# Patient Record
Sex: Male | Born: 1950
Health system: Southern US, Community
[De-identification: ages and names within clinical notes are randomized; demographics above are authoritative.]

## PROBLEM LIST (undated history)

## (undated) DIAGNOSIS — N529 Male erectile dysfunction, unspecified: Secondary | ICD-10-CM

## (undated) DIAGNOSIS — R7303 Prediabetes: Secondary | ICD-10-CM

## (undated) DIAGNOSIS — Z639 Problem related to primary support group, unspecified: Secondary | ICD-10-CM

## (undated) DIAGNOSIS — I10 Essential (primary) hypertension: Secondary | ICD-10-CM

## (undated) DIAGNOSIS — F329 Major depressive disorder, single episode, unspecified: Secondary | ICD-10-CM

## (undated) DIAGNOSIS — F32A Depression, unspecified: Secondary | ICD-10-CM

## (undated) DIAGNOSIS — R17 Unspecified jaundice: Secondary | ICD-10-CM

## (undated) DIAGNOSIS — I2510 Atherosclerotic heart disease of native coronary artery without angina pectoris: Secondary | ICD-10-CM

## (undated) DIAGNOSIS — D696 Thrombocytopenia, unspecified: Secondary | ICD-10-CM

## (undated) DIAGNOSIS — E785 Hyperlipidemia, unspecified: Secondary | ICD-10-CM

## (undated) HISTORY — DX: Major depressive disorder, single episode, unspecified: F32.9

## (undated) HISTORY — DX: Atherosclerotic heart disease of native coronary artery without angina pectoris: I25.10

## (undated) HISTORY — DX: Male erectile dysfunction, unspecified: N52.9

## (undated) HISTORY — DX: Problem related to primary support group, unspecified: Z63.9

## (undated) HISTORY — DX: Thrombocytopenia, unspecified: D69.6

## (undated) HISTORY — DX: Depression, unspecified: F32.A

## (undated) HISTORY — DX: Hyperlipidemia, unspecified: E78.5

## (undated) HISTORY — DX: Essential (primary) hypertension: I10

## (undated) HISTORY — DX: Prediabetes: R73.03

## (undated) HISTORY — DX: Unspecified jaundice: R17

---
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
## 1994-09-22 HISTORY — PX: HERNIA REPAIR: SHX51

## 2003-03-23 HISTORY — PX: CORONARY ANGIOPLASTY WITH STENT PLACEMENT: SHX49

## 2003-03-23 HISTORY — PX: CARDIAC CATHETERIZATION: SHX172

## 2003-04-23 HISTORY — PX: CORONARY ANGIOPLASTY WITH STENT PLACEMENT: SHX49

## 2004-08-21 ENCOUNTER — Ambulatory Visit: Payer: Self-pay | Admitting: Family Medicine

## 2005-03-22 HISTORY — PX: OTHER SURGICAL HISTORY: SHX169

## 2005-04-14 ENCOUNTER — Ambulatory Visit: Payer: Self-pay | Admitting: Internal Medicine

## 2005-04-15 ENCOUNTER — Encounter: Payer: Self-pay | Admitting: Family Medicine

## 2005-04-15 LAB — CONVERTED CEMR LAB: Blood Glucose, Fasting: 81 mg/dL

## 2005-04-21 ENCOUNTER — Ambulatory Visit: Payer: Self-pay

## 2005-05-08 ENCOUNTER — Emergency Department (HOSPITAL_COMMUNITY): Admission: EM | Admit: 2005-05-08 | Discharge: 2005-05-08 | Payer: Self-pay | Admitting: Emergency Medicine

## 2005-05-13 ENCOUNTER — Ambulatory Visit: Payer: Self-pay | Admitting: Cardiology

## 2005-05-21 ENCOUNTER — Ambulatory Visit: Payer: Self-pay

## 2005-12-08 ENCOUNTER — Ambulatory Visit: Payer: Self-pay | Admitting: Family Medicine

## 2006-01-19 ENCOUNTER — Ambulatory Visit: Payer: Self-pay | Admitting: Family Medicine

## 2006-05-11 ENCOUNTER — Ambulatory Visit: Payer: Self-pay | Admitting: Family Medicine

## 2006-10-05 ENCOUNTER — Ambulatory Visit: Payer: Self-pay | Admitting: Family Medicine

## 2006-10-05 LAB — CONVERTED CEMR LAB
ALT: 44 units/L — ABNORMAL HIGH (ref 0–40)
BUN: 14 mg/dL (ref 6–23)
CO2: 31 meq/L (ref 19–32)
Calcium: 9.2 mg/dL (ref 8.4–10.5)
Chloride: 109 meq/L (ref 96–112)
Chol/HDL Ratio, serum: 2.6
Cholesterol: 136 mg/dL (ref 0–200)
Creatinine, Ser: 1 mg/dL (ref 0.4–1.5)
GFR calc non Af Amer: 83 mL/min
HDL: 52.5 mg/dL (ref >39.0)
LDL Cholesterol: 66 mg/dL (ref 0–99)
Sodium: 144 meq/L (ref 135–145)
TSH: 1.21 microintl units/mL (ref 0.35–5.50)
Total Protein: 6.3 g/dL (ref 6.0–8.3)

## 2006-10-26 ENCOUNTER — Ambulatory Visit: Payer: Self-pay | Admitting: Family Medicine

## 2008-01-11 ENCOUNTER — Encounter: Payer: Self-pay | Admitting: Family Medicine

## 2008-01-11 ENCOUNTER — Ambulatory Visit: Payer: Self-pay | Admitting: Family Medicine

## 2008-01-11 DIAGNOSIS — I10 Essential (primary) hypertension: Secondary | ICD-10-CM | POA: Insufficient documentation

## 2008-01-11 DIAGNOSIS — I2510 Atherosclerotic heart disease of native coronary artery without angina pectoris: Secondary | ICD-10-CM | POA: Insufficient documentation

## 2008-01-11 DIAGNOSIS — E1169 Type 2 diabetes mellitus with other specified complication: Secondary | ICD-10-CM | POA: Insufficient documentation

## 2008-01-11 DIAGNOSIS — E782 Mixed hyperlipidemia: Secondary | ICD-10-CM | POA: Insufficient documentation

## 2008-01-24 ENCOUNTER — Telehealth: Payer: Self-pay | Admitting: Family Medicine

## 2008-02-15 ENCOUNTER — Ambulatory Visit: Payer: Self-pay | Admitting: Family Medicine

## 2008-02-15 LAB — CONVERTED CEMR LAB
ALT: 49 units/L (ref 0–53)
Albumin: 4.5 g/dL (ref 3.5–5.2)
Alkaline Phosphatase: 69 units/L (ref 39–117)
BUN: 17 mg/dL (ref 6–23)
Basophils Absolute: 0 10*3/uL (ref 0.0–0.1)
Bilirubin, Direct: 0.2 mg/dL (ref 0.0–0.3)
Eosinophils Absolute: 0.1 10*3/uL (ref 0.0–0.7)
GFR calc non Af Amer: 93 mL/min
Glucose, Bld: 121 mg/dL — ABNORMAL HIGH (ref 70–99)
HCT: 53.6 % — ABNORMAL HIGH (ref 39.0–52.0)
HDL: 47 mg/dL (ref >39.0)
Hemoglobin: 18.1 g/dL — ABNORMAL HIGH (ref 13.0–17.0)
LDL Cholesterol: 117 mg/dL — ABNORMAL HIGH (ref 0–99)
Lymphocytes Relative: 15.4 % (ref 12.0–46.0)
MCV: 93.9 fL (ref 78.0–100.0)
Monocytes Absolute: 0.6 10*3/uL (ref 0.1–1.0)
Neutrophils Relative %: 75.4 % (ref 43.0–77.0)
PSA: 0.46 ng/mL (ref 0.10–4.00)
Platelets: 151 10*3/uL (ref 150–400)
Potassium: 4.1 meq/L (ref 3.5–5.1)
Total Protein: 6.8 g/dL (ref 6.0–8.3)
Triglycerides: 115 mg/dL (ref 0–149)
WBC: 7.2 10*3/uL (ref 4.5–10.5)

## 2008-05-19 ENCOUNTER — Encounter (INDEPENDENT_AMBULATORY_CARE_PROVIDER_SITE_OTHER): Payer: Self-pay | Admitting: *Deleted

## 2008-06-05 ENCOUNTER — Telehealth: Payer: Self-pay | Admitting: Family Medicine

## 2008-10-16 ENCOUNTER — Ambulatory Visit: Payer: Self-pay | Admitting: Family Medicine

## 2008-10-16 LAB — CONVERTED CEMR LAB
ALT: 41 units/L (ref 0–53)
AST: 24 units/L (ref 0–37)
Alkaline Phosphatase: 76 units/L (ref 39–117)
BUN: 15 mg/dL (ref 6–23)
Basophils Absolute: 0 10*3/uL (ref 0.0–0.1)
CO2: 27 meq/L (ref 19–32)
Calcium: 9.2 mg/dL (ref 8.4–10.5)
Cholesterol: 136 mg/dL (ref 0–200)
Eosinophils Relative: 1.5 % (ref 0.0–5.0)
GFR calc non Af Amer: 82 mL/min
Glucose, Bld: 112 mg/dL — ABNORMAL HIGH (ref 70–99)
MCV: 92.9 fL (ref 78.0–100.0)
Neutro Abs: 4.8 10*3/uL (ref 1.4–7.7)
Potassium: 4.7 meq/L (ref 3.5–5.1)
RBC: 5.39 M/uL (ref 4.22–5.81)
RDW: 12.4 % (ref 11.5–14.6)
Total Bilirubin: 1.4 mg/dL — ABNORMAL HIGH (ref 0.3–1.2)
Triglycerides: 128 mg/dL (ref 0–149)
VLDL: 26 mg/dL (ref 0–40)
WBC: 6.5 10*3/uL (ref 4.5–10.5)

## 2008-10-19 ENCOUNTER — Ambulatory Visit: Payer: Self-pay | Admitting: Family Medicine

## 2008-10-19 DIAGNOSIS — E119 Type 2 diabetes mellitus without complications: Secondary | ICD-10-CM | POA: Insufficient documentation

## 2008-10-19 DIAGNOSIS — E1169 Type 2 diabetes mellitus with other specified complication: Secondary | ICD-10-CM | POA: Insufficient documentation

## 2008-10-19 DIAGNOSIS — R739 Hyperglycemia, unspecified: Secondary | ICD-10-CM

## 2008-10-19 DIAGNOSIS — E1165 Type 2 diabetes mellitus with hyperglycemia: Secondary | ICD-10-CM | POA: Insufficient documentation

## 2008-10-19 DIAGNOSIS — R809 Proteinuria, unspecified: Secondary | ICD-10-CM | POA: Insufficient documentation

## 2008-10-19 DIAGNOSIS — R17 Unspecified jaundice: Secondary | ICD-10-CM | POA: Insufficient documentation

## 2008-11-17 ENCOUNTER — Ambulatory Visit: Payer: Self-pay | Admitting: Family Medicine

## 2008-11-17 LAB — CONVERTED CEMR LAB
OCCULT 1: NEGATIVE
OCCULT 2: NEGATIVE

## 2008-11-20 ENCOUNTER — Encounter (INDEPENDENT_AMBULATORY_CARE_PROVIDER_SITE_OTHER): Payer: Self-pay | Admitting: *Deleted

## 2008-12-18 ENCOUNTER — Ambulatory Visit: Payer: Self-pay | Admitting: Family Medicine

## 2009-12-17 ENCOUNTER — Telehealth: Payer: Self-pay | Admitting: Family Medicine

## 2009-12-24 ENCOUNTER — Ambulatory Visit: Payer: Self-pay | Admitting: Family Medicine

## 2009-12-24 LAB — CONVERTED CEMR LAB
LDL Cholesterol: 66 mg/dL (ref 0–99)
Total CHOL/HDL Ratio: 3
Triglycerides: 89 mg/dL (ref 0.0–149.0)
VLDL: 17.8 mg/dL (ref 0.0–40.0)

## 2009-12-26 ENCOUNTER — Ambulatory Visit: Payer: Self-pay | Admitting: Family Medicine

## 2010-01-23 ENCOUNTER — Observation Stay (HOSPITAL_COMMUNITY): Admission: EM | Admit: 2010-01-23 | Discharge: 2010-01-24 | Payer: Self-pay | Admitting: Internal Medicine

## 2010-01-23 ENCOUNTER — Ambulatory Visit: Payer: Self-pay | Admitting: Internal Medicine

## 2010-01-23 ENCOUNTER — Encounter: Payer: Self-pay | Admitting: Emergency Medicine

## 2010-01-23 HISTORY — PX: CORONARY ANGIOPLASTY WITH STENT PLACEMENT: SHX49

## 2010-02-06 ENCOUNTER — Telehealth: Payer: Self-pay | Admitting: Cardiovascular Disease

## 2010-02-06 ENCOUNTER — Ambulatory Visit: Payer: Self-pay | Admitting: Cardiovascular Disease

## 2010-02-22 ENCOUNTER — Telehealth: Payer: Self-pay | Admitting: Cardiovascular Disease

## 2010-03-06 ENCOUNTER — Telehealth: Payer: Self-pay | Admitting: Cardiovascular Disease

## 2010-04-12 ENCOUNTER — Telehealth: Payer: Self-pay | Admitting: Cardiovascular Disease

## 2010-04-23 ENCOUNTER — Telehealth: Payer: Self-pay | Admitting: Family Medicine

## 2010-04-25 ENCOUNTER — Ambulatory Visit: Payer: Self-pay | Admitting: Family Medicine

## 2010-04-25 LAB — CONVERTED CEMR LAB
ALT: 51 units/L (ref 0–53)
Albumin: 4.5 g/dL (ref 3.5–5.2)
Alkaline Phosphatase: 67 units/L (ref 39–117)
Basophils Relative: 0.4 % (ref 0.0–3.0)
CO2: 28 meq/L (ref 19–32)
Calcium: 9.4 mg/dL (ref 8.4–10.5)
GFR calc non Af Amer: 91.21 mL/min (ref >60)
HCT: 45.4 % (ref 39.0–52.0)
Hemoglobin: 16 g/dL (ref 13.0–17.0)
Lymphs Abs: 1 10*3/uL (ref 0.7–4.0)
MCV: 96.4 fL (ref 78.0–100.0)
Monocytes Relative: 8.5 % (ref 3.0–12.0)
Neutro Abs: 3.8 10*3/uL (ref 1.4–7.7)
PSA: 0.47 ng/mL (ref 0.10–4.00)
RBC: 4.71 M/uL (ref 4.22–5.81)
Total Bilirubin: 1.5 mg/dL — ABNORMAL HIGH (ref 0.3–1.2)
WBC: 5.3 10*3/uL (ref 4.5–10.5)

## 2010-04-30 ENCOUNTER — Ambulatory Visit: Payer: Self-pay | Admitting: Internal Medicine

## 2010-04-30 ENCOUNTER — Encounter (INDEPENDENT_AMBULATORY_CARE_PROVIDER_SITE_OTHER): Payer: Self-pay | Admitting: *Deleted

## 2010-04-30 DIAGNOSIS — D696 Thrombocytopenia, unspecified: Secondary | ICD-10-CM | POA: Insufficient documentation

## 2010-05-07 ENCOUNTER — Encounter (INDEPENDENT_AMBULATORY_CARE_PROVIDER_SITE_OTHER): Payer: Self-pay | Admitting: *Deleted

## 2010-05-09 ENCOUNTER — Telehealth: Payer: Self-pay | Admitting: Family Medicine

## 2010-05-09 ENCOUNTER — Encounter (INDEPENDENT_AMBULATORY_CARE_PROVIDER_SITE_OTHER): Payer: Self-pay | Admitting: *Deleted

## 2010-05-15 ENCOUNTER — Encounter: Payer: Self-pay | Admitting: Family Medicine

## 2010-05-15 ENCOUNTER — Ambulatory Visit: Payer: Self-pay | Admitting: Internal Medicine

## 2010-05-15 LAB — CONVERTED CEMR LAB
Bilirubin Urine: NEGATIVE
Glucose, Urine, Semiquant: NEGATIVE
Ketones, urine, test strip: NEGATIVE
Protein, U semiquant: NEGATIVE
WBC Urine, dipstick: NEGATIVE
pH: 6

## 2010-05-16 ENCOUNTER — Ambulatory Visit: Payer: Self-pay | Admitting: Internal Medicine

## 2010-06-11 ENCOUNTER — Ambulatory Visit: Payer: Self-pay | Admitting: Family Medicine

## 2010-06-13 ENCOUNTER — Telehealth: Payer: Self-pay | Admitting: Family Medicine

## 2010-06-13 ENCOUNTER — Telehealth: Payer: Self-pay | Admitting: Cardiovascular Disease

## 2010-06-19 ENCOUNTER — Ambulatory Visit: Payer: Self-pay | Admitting: Family Medicine

## 2010-06-20 ENCOUNTER — Encounter: Payer: Self-pay | Admitting: Family Medicine

## 2010-06-20 ENCOUNTER — Encounter (INDEPENDENT_AMBULATORY_CARE_PROVIDER_SITE_OTHER): Payer: Self-pay | Admitting: *Deleted

## 2010-06-20 LAB — CONVERTED CEMR LAB: Fecal Occult Bld: NEGATIVE

## 2010-07-04 ENCOUNTER — Telehealth: Payer: Self-pay | Admitting: Cardiovascular Disease

## 2010-07-29 ENCOUNTER — Telehealth (INDEPENDENT_AMBULATORY_CARE_PROVIDER_SITE_OTHER): Payer: Self-pay | Admitting: *Deleted

## 2010-07-30 ENCOUNTER — Ambulatory Visit: Payer: Self-pay | Admitting: Family Medicine

## 2010-07-30 ENCOUNTER — Ambulatory Visit: Payer: Self-pay | Admitting: Internal Medicine

## 2010-07-31 LAB — CONVERTED CEMR LAB
Basophils Relative: 0.4 % (ref 0.0–3.0)
CO2: 32 meq/L (ref 19–32)
Chloride: 103 meq/L (ref 96–112)
Eosinophils Absolute: 0 10*3/uL (ref 0.0–0.7)
Eosinophils Relative: 0.5 % (ref 0.0–5.0)
GFR calc non Af Amer: 84.65 mL/min (ref >60)
Glucose, Bld: 109 mg/dL — ABNORMAL HIGH (ref 70–99)
Lymphocytes Relative: 22.1 % (ref 12.0–46.0)
Monocytes Relative: 8.2 % (ref 3.0–12.0)
Neutrophils Relative %: 68.8 % (ref 43.0–77.0)
Potassium: 4.2 meq/L (ref 3.5–5.1)
RBC: 4.92 M/uL (ref 4.22–5.81)
Sodium: 141 meq/L (ref 135–145)
WBC: 6.3 10*3/uL (ref 4.5–10.5)

## 2010-08-20 ENCOUNTER — Ambulatory Visit: Payer: Self-pay | Admitting: Internal Medicine

## 2010-09-20 ENCOUNTER — Encounter: Payer: Self-pay | Admitting: Cardiovascular Disease

## 2010-09-20 ENCOUNTER — Ambulatory Visit
Admission: RE | Admit: 2010-09-20 | Discharge: 2010-09-20 | Payer: Self-pay | Source: Home / Self Care | Attending: Cardiovascular Disease | Admitting: Cardiovascular Disease

## 2010-09-20 DIAGNOSIS — N529 Male erectile dysfunction, unspecified: Secondary | ICD-10-CM | POA: Insufficient documentation

## 2010-10-22 ENCOUNTER — Telehealth: Payer: Self-pay | Admitting: Family Medicine

## 2010-10-22 NOTE — Assessment & Plan Note (Signed)
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  has to be at job for 90 days to get insurance.  currently on Cobra.  HLD - crestor working out well.  asks about adding fish oil to diet.  HTN - no HA, vision changes, chest pain, tightness, SOB, urinary changes, LE swelling.  tolerating meds well.  CAD - recent stent this year - on plavix and asa.  to schedule appt with [facility].    prediabetes - cbg fasting 109 last check, down from previously.  Current Medications (verified): 1)  Crestor 20 Mg  Tabs (Rosuvastatin Calcium) .Marland Kitchen.. 1 Daily By Mouth At Bedtime 2)  Hydrochlorothiazide 25 Mg Tabs (Hydrochlorothiazide) .... Take One Daily For Blood Pressure 3)  Aspirin 325 Mg Tabs (Aspirin) .... One Daily 4)  Metoprolol Tartrate 25 Mg Tabs (Metoprolol Tartrate) .Marland Kitchen.. 1 By Mouth Two Times A Day 5)  Plavix 75 Mg Tabs (Clopidogrel Bisulfate) .... Take One Tablet By Mouth Daily 6)  Nitrostat 0.4 Mg Subl (Nitroglycerin) .Marland Kitchen.. 1 Tablet Under Tongue At Onset of Chest Pain; You May Repeat Every 5 Minutes For Up To 3 Doses.  Allergies: 1)  Effient (Prasugrel Hcl)  Past History:  Social History: Last updated: 04/30/2010 Marital Status: Married LIVES WITH WIFE Children: 1 13 YOA Occupation: Unemployed, wants to start driving trucks  Past Medical History: CAD Hyperlipidemia Hypertension Prediabetes thrombocytopenia former smoker (quit 01/2010)  Review of Systems       per HPI  Physical Exam  General:  Well-developed,well-nourished,in no acute distress; alert,appropriate and cooperative  throughout examination Mouth:  Oral mucosa and oropharynx without lesions or exudates.  Teeth in good repair. Neck:  No deformities, masses, or tenderness noted.  no bruits Lungs:  Normal respiratory effort, chest expands symmetrically. Lungs are clear to auscultation, no crackles or wheezes. Heart:  Normal rate and regular rhythm. S1 and S2 normal without gallop, murmur, click, rub or other extra sounds. Abdomen:  Bowel sounds positive,abdomen soft and non-tender without masses, organomegaly or hernias noted.  no abd/renal bruits Pulses:  2+ rad pulses Extremities:  no pedal edema Skin:  Intact without suspicious lesions or rashes   Impression & Recommendations:  Problem # 1:  HYPERTENSION, BENIGN ESSENTIAL (ICD-401.1) continue current meds.  adequate control.  His updated medication list for this problem includes:    Hydrochlorothiazide 25 Mg Tabs (Hydrochlorothiazide) .Marland Kitchen... Take one daily for blood pressure    Metoprolol Tartrate 25 Mg Tabs (Metoprolol tartrate) .Marland Kitchen... 1 by mouth two times a day  BP today: 130/84 Prior BP: 140/84 (07/30/2010)  Labs Reviewed: K+: 4.2 (07/30/2010) Creat: : 1.0 (07/30/2010)   Chol: 136 (12/24/2009)   HDL: 52.10 (12/24/2009)   LDL: 66 (12/24/2009)   TG: 89.0 (12/24/2009)  Problem # 2:  HYPERLIPIDEMIA (ICD-272.4) continue crestor - good control.  consider atorvastatin down road when affordable.  rec against fish oil for now, to check with cards. His updated medication list for this problem includes:    Crestor 20 Mg Tabs (Rosuvastatin  calcium) .Marland Kitchen... 1 daily by mouth at bedtime  Labs Reviewed: SGOT: 27 (04/25/2010)   SGPT: 51 (04/25/2010)   HDL:52.10 (12/24/2009), 47.8 (10/16/2008)  LDL:66 (12/24/2009), 63 (10/16/2008)  Chol:136 (12/24/2009), 136 (10/16/2008)  Trig:89.0 (12/24/2009), 128 (10/16/2008)  Problem # 3:  CAD (ICD-414.00) continue current meds. His updated medication list for this problem includes:    Hydrochlorothiazide 25 Mg Tabs  (Hydrochlorothiazide) .Marland Kitchen... Take one daily for blood pressure    Aspirin 325 Mg Tabs (Aspirin) ..... One daily    Metoprolol Tartrate 25 Mg Tabs (Metoprolol tartrate) .Marland Kitchen... 1 by mouth two times a day    Plavix 75 Mg Tabs (Clopidogrel bisulfate) .Marland Kitchen... Take one tablet by mouth daily    Nitrostat 0.4 Mg Subl (Nitroglycerin) .Marland Kitchen... 1 tablet under tongue at onset of chest pain; you may repeat every 5 minutes for up to 3 doses.  Labs Reviewed: Chol: 136 (12/24/2009)   HDL: 52.10 (12/24/2009)   LDL: 66 (12/24/2009)   TG: 89.0 (12/24/2009)  Problem # 4:  PREDIABETES (ICD-790.29) cbg 109.  stable.  consider checking A1c next blood draw. Labs Reviewed: Creat: 1.0 (07/30/2010)     Problem # 5:  THROMBOCYTOPENIA (ICD-287.5) plt 142 last check, on plavix/asa.  continue to monitor.    Problem # 6:  Preventive Health Care (ICD-V70.0) rec get flu shot at pharmacy (cheaper)  Complete Medication List: 1)  Crestor 20 Mg Tabs (Rosuvastatin calcium) .Marland Kitchen.. 1 daily by mouth at bedtime 2)  Hydrochlorothiazide 25 Mg Tabs (Hydrochlorothiazide) .... Take one daily for blood pressure 3)  Aspirin 325 Mg Tabs (Aspirin) .... One daily 4)  Metoprolol Tartrate 25 Mg Tabs (Metoprolol tartrate) .Marland Kitchen.. 1 by mouth two times a day 5)  Plavix 75 Mg Tabs (Clopidogrel bisulfate) .... Take one tablet by mouth daily 6)  Nitrostat 0.4 Mg Subl (Nitroglycerin) .Marland Kitchen.. 1 tablet under tongue at onset of chest pain; you may repeat every 5 minutes for up to 3 doses.  Patient Instructions: 1)  Please return in 6 mo for follow up. 2)  Continue meds as up to now.   3)  Continue to ride bike for exercise, incorporate more fish into diet. 4)  Stay away from added sugars, simple white bread/starches. 5)  Good to see you otday.  Call clinic with questions.   Orders Added: 1)  Est. Patient Level IV [81191]    Current Allergies (reviewed today): EFFIENT (PRASUGREL HCL)   Prevention & Chronic Care Immunizations   Influenza vaccine: Not  documented    Tetanus booster: 04/14/2005: Td   Tetanus booster due: 04/15/2015    Pneumococcal vaccine: Not documented  Colorectal Screening   Hemoccult: negative  (06/19/2010)   Hemoccult action/deferral: Ordered  (04/30/2010)   Hemoccult due: 06/2011    Colonoscopy: Not documented  Other Screening   PSA: 0.47  (04/25/2010)   PSA due due: 04/26/2011   Smoking status: quit > 6 months  (04/30/2010)  Lipids   Total Cholesterol: 136  (12/24/2009)   LDL: 66  (12/24/2009)   LDL Direct: 72.1  (07/30/2010)   HDL: 52.10  (12/24/2009)   Triglycerides: 89.0  (12/24/2009)    SGOT (AST): 27  (04/25/2010)   SGPT (ALT): 51  (04/25/2010)   Alkaline phosphatase: 67  (04/25/2010)   Total bilirubin: 1.5  (04/25/2010)  Hypertension   Last Blood Pressure: 130 / 84  (08/20/2010)   Serum creatinine: 1.0  (07/30/2010)   Serum potassium 4.2  (07/30/2010)  Self-Management Support :   Personal Goals (by the next clinic visit) :  Personal blood pressure goal: 140/90  (04/30/2010)     Personal LDL goal: 70  (04/30/2010)    Hypertension self-management support: Not documented    Lipid self-management support: Not documented

## 2010-10-22 NOTE — Progress Notes (Signed)
Summary: wants DOT form filled out  Phone Note Call from Patient Call back at Home Phone [phone]   Caller: Patient Call For: Eustaquio Boyden  MD Summary of Call: Pt was in earlier this month for a new pt appt.  He needs a DOT physical and is asking if he can get this based on what was done at that visit.  I told him he would need vision, urine and hearing tests, at least.          Lowella Petties [facility]  May 09, 2010 2:57 PM  Initial call taken by: Lowella Petties CMA,  May 09, 2010 2:58 PM  Follow-up for Phone Call        i agree.  ask him to bring DOT physical form in to see what else is required. Follow-up by: Eustaquio Boyden  MD,  May 09, 2010 8:36 PM  Additional Follow-up for Phone Call Additional follow up Details #1::        Patient has appointment tomorrow and will bring form Additional Follow-up by: Janee Morn CMA Duncan Dull),  May 14, 2010 4:09 PM

## 2010-10-22 NOTE — Letter (Signed)
Summary: Generic Letter  [place] at [facility]  [address] [place], [place] [address]   Phone: [phone]  Fax: [phone]    05/07/2010  Joseph Shea [address] [place], [place]  [address]  Dear Mr. Soule,        The cost of the immunoassay fecal occult blood test (ifob) is $25.00, if you decide to do the test please notify me. I will need to send notification to the Laboratory performing the test.    Sincerely,   [name] [phone]

## 2010-10-22 NOTE — Progress Notes (Signed)
Summary: MEDICATION PROBLEMS   Phone Note Call from Patient Call back at [phone]   Caller: SELF Call For: [facility] Summary of Call: THINKS THAT HE IS HAVING AN ALLERGIC REACTION TO EFFIENT-WAS RECENTLY PLACED ON THIS Initial call taken by: Harlon Flor,  Feb 06, 2010 8:14 AM  Follow-up for Phone Call        Do not worry about calling this patient back.  They are coming in today for an appointment in a spot where we had a cancellation. Follow-up by: Harlon Flor,  Feb 06, 2010 8:57 AM

## 2010-10-22 NOTE — Letter (Signed)
Summary: Results Follow up Letter  [place] at [facility]  [address] [place], [place] [address]   Phone: [phone]  Fax: [phone]    06/20/2010 MRN: [id number]  Joseph Shea [address] [place], [place]  [address]  Dear Mr. Marron,  The following are the results of your recent test(s):  Test         Result    Pap Smear:        Normal _____  Not Normal _____ Comments: ______________________________________________________ Cholesterol: LDL(Bad cholesterol):         Your goal is less than:         HDL (Good cholesterol):       Your goal is more than: Comments:  ______________________________________________________ Mammogram:        Normal _____  Not Normal _____ Comments:  ___________________________________________________________________ Hemoccult:        Normal __X___  Not normal _______ Comments:  Repeat in 1 year  _____________________________________________________________________ Other Tests:    We routinely do not discuss normal results over the telephone.  If you desire a copy of the results, or you have any questions about this information we can discuss them at your next office visit.   Sincerely,    Dr. Eustaquio Boyden

## 2010-10-22 NOTE — Progress Notes (Signed)
Summary: samples of plavix   Phone Note Call from Patient   Reason for Call: Talk to Nurse Summary of Call: wants to know if we have any samples of plavix-called dr [facility]ell [facility] office and they don't have any-told him to try here- pls call [phone]  Initial call taken by: Glynda Jaeger,  April 12, 2010 2:27 PM  Follow-up for Phone Call        Spoke with pt. Patient states he is unemployed. He  states can't afford  to get Plavix 75 mg rescription refill. He has filled the form for Pt. assistant program with [facility].  Pt. would like to know if we have samples of medication. Plavix  12/75mg  samples placed to the front desk. Pt aware. Follow-up by: Ollen Gross, RN, BSN,  April 12, 2010 2:44 PM

## 2010-10-22 NOTE — Progress Notes (Signed)
----   Converted from flag ---- ---- 07/29/2010 2:09 PM, Eustaquio Boyden  MD wrote: then lets do dLDL, CBC with periph smear, and bmp (272.4, 401.9, 790.29, 287.5)  thanks!  ---- 07/29/2010 2:03 PM, [name] wrote: In your 8.9.11 notes, you said check chol in 3 months.  ---- 07/29/2010 1:08 PM, Eustaquio Boyden  MD wrote: neither, probably would want to see him first.  did cards want blood drawn?  not due for cholesterol check.  ---- 07/29/2010 12:58 PM, [name] wrote: Do you want total cholesterol or lipid panel? Thanks, Terri ------------------------------

## 2010-10-22 NOTE — Letter (Signed)
Summary: [facility]: Immunoassay Fecal Occult Blood (iFOB) Order Form  [place] at [facility]  [address] [place], [place] [address]   Phone: [phone]  Fax: [phone]      [facility]: Immunoassay Fecal Occult Blood (iFOB) Order Form   May 09, 2010 MRN: [id number]   Joseph Shea 1951/08/11   Physicican Name:__Javier Gutierrez_______________________  Diagnosis Code:___v76.49_______________________      [name]

## 2010-10-22 NOTE — Progress Notes (Signed)
Summary: labs prior to visit?  Phone Note Call from Patient Call back at Home Phone [phone]   Caller: Patient Summary of Call: Pt is coming in to see you next week,  he is asking if he should have labs prior.  Lowella Petties CMA  April 23, 2010 1:19 PM   Follow-up for Phone Call        due for CBC (782.4), CMP (272.0), PSA (V76.44) prior. Follow-up by: Eustaquio Boyden  MD,  April 23, 2010 1:33 PM  Additional Follow-up for Phone Call Additional follow up Details #1::        Advised pt, lab appt made. Additional Follow-up by: Lowella Petties CMA,  April 23, 2010 2:46 PM

## 2010-10-22 NOTE — Progress Notes (Signed)
Summary: Metoprolo Refill  Phone Note Refill Request Call back at Pharmacy # [phone] Message from:  Karin Golden on May 09, 2010 9:57 AM  Refills Requested: Medication #1:  METOPROLOL TARTRATE 25 MG TABS 1 by mouth two times a day   Last Refilled: 11/30/2009 Med list here shows 1 by mouth two times a day. Pharmacy refill shows 1 1/2 two times a day. Can you advise please. Thanks!   Method Requested: Electronic Initial call taken by: Janee Morn CMA Duncan Dull),  May 09, 2010 9:58 AM  Follow-up for Phone Call        as of last OV note, sig was 1 by mouth two times a day.  let's start there.  thanks. Follow-up by: Eustaquio Boyden  MD,  May 09, 2010 10:07 AM  Additional Follow-up for Phone Call Additional follow up Details #1::        Done Additional Follow-up by: Janee Morn CMA Duncan Dull),  May 09, 2010 10:19 AM

## 2010-10-22 NOTE — Progress Notes (Signed)
Summary: plavix samples  Medications Added PLAVIX 75 MG TABS (CLOPIDOGREL BISULFATE) Take one tablet by mouth daily       Phone Note Call from Patient   Caller: Patient Summary of Call: The pt called today requesting Plavix samples. Samples given. Initial call taken by: Sherri Rad, RN, BSN,  February 22, 2010 12:58 PM    New/Updated Medications: PLAVIX 75 MG TABS (CLOPIDOGREL BISULFATE) Take one tablet by mouth daily Prescriptions: PLAVIX 75 MG TABS (CLOPIDOGREL BISULFATE) Take one tablet by mouth daily  #12 x 0   Entered by:   Sherri Rad, RN, BSN   Authorized by:   Dossie Arbour MD   Signed by:   Sherri Rad, RN, BSN on 02/22/2010   Method used:   Samples Given   RxID:   [id number]

## 2010-10-22 NOTE — Assessment & Plan Note (Signed)
Summary: F/U PER DR Maddox Hlavaty/NT   Vital Signs:  Patient profile:   60 year old male Weight:      182.25 pounds BMI:     27.01 Temp:     98.3 degrees F oral Pulse rate:   64 / minute Pulse rhythm:   regular BP sitting:   150 / 100  (left arm) Cuff size:   regular  Vitals Entered By: Sydell Axon LPN (December 26, 1608 10:06 AM) CC: follow-up visit   History of Present Illness: Pt here for inability to afford Crestor 20mg  which he has been on since stents and PTCA in 2004 precluding MI. He has always been on Crestor and at 20mg , impluying high Trigs ans LDL and poss low HDL but I do not have that information. He tolerates the Crestor w./o difficulty of any kind and is aware of the probs because his mother has many of them. He hasn't been herre in over a year. He i very stressed as he barely has a job which is making his financial lif miserable, his marriage is failing and his son has been a problem. He admits to some times being very down.  Problems Prior to Update: 1)  Special Screening Malig Neoplasms Other Sites  (ICD-V76.49) 2)  Hyperbilirubinemia  (ICD-782.4) 3)  Hyperglycemia  (ICD-790.29) 4)  Special Screening Malignant Neoplasm of Prostate  (ICD-V76.44) 5)  Hyperlipidemia  (ICD-272.4) 6)  Hypertension, Benign Essential  (ICD-401.1) 7)  Cad  (ICD-414.00)  Medications Prior to Update: 1)  Crestor 20 Mg  Tabs (Rosuvastatin Calcium) .Marland Kitchen.. 1 Daily By Mouth At Bedtime 2)  Hydrochlorothiazide 12.5 Mg  Tabs (Hydrochlorothiazide) .Marland Kitchen.. 1 Daily By Mouth 3)  Bayer Low Strength 81 Mg  Tbec (Aspirin) .Marland Kitchen.. 1 Daily By Mouth 4)  Metoprolol Tartrate 25 Mg Tabs (Metoprolol Tartrate) .Marland Kitchen.. 11/2 Tabs By Mouth Two Times A Day.  Allergies: No Known Drug Allergies  Physical Exam  General:  Well-developed,well-nourished,in no acute distress; alert,appropriate and cooperative throughout examination Head:  Normocephalic and atraumatic without obvious abnormalities. No apparent alopecia or  balding. Eyes:  Conjunctiva clear bilaterally.  Ears:  External ear exam shows no significant lesions or deformities.  Otoscopic examination reveals clear canals, tympanic membranes are intact bilaterally without bulging, retraction, inflammation or discharge. Hearing is grossly normal bilaterally. Nose:  External nasal examination shows no deformity or inflammation. Nasal mucosa are pink and moist without lesions or exudates. Mouth:  Oral mucosa and oropharynx without lesions or exudates.  Teeth in good repair. Neck:  No deformities, masses, or tenderness noted. Lungs:  Normal respiratory effort, chest expands symmetrically. Lungs are clear to auscultation, no crackles or wheezes. Heart:  Normal rate and regular rhythm. S1 and S2 normal without gallop, murmur, click, rub or other extra sounds.   Impression & Recommendations:  Problem # 1:  HYPERLIPIDEMIA (ICD-272.4) Assessment Unchanged Great control but have been giving him samples for a whila and can't keep doing that. Gave him the last 6 weeks of merdication I have. Write the co that makes Crestor. Let me know. May need generic statijn and Niacin. Will have to poss try in the future. His updated medication list for this problem includes:    Crestor 20 Mg Tabs (Rosuvastatin calcium) .Marland Kitchen... 1 daily by mouth at bedtime  Labs Reviewed: SGOT: 24 (12/24/2009)   SGPT: 50 (12/24/2009)   HDL:52.10 (12/24/2009), 47.8 (10/16/2008)  LDL:66 (12/24/2009), 63 (10/16/2008)  Chol:136 (12/24/2009), 136 (10/16/2008)  Trig:89.0 (12/24/2009), 128 (10/16/2008)  Problem # 2:  HYPERTENSION,  BENIGN ESSENTIAL (ICD-401.1) Assessment: Deteriorated Incr Metoprolol to 50 two times a day. His updated medication list for this problem includes:    Hydrochlorothiazide 12.5 Mg Tabs (Hydrochlorothiazide) .Marland Kitchen... 1 daily by mouth    Metoprolol Tartrate 50 Mg Tabs (Metoprolol tartrate) ..... One tab by mouth two times a day  BP today: 150/100 Prior BP: 140/80  (12/18/2008)  Labs Reviewed: K+: 4.7 (10/16/2008) Creat: : 1.0 (10/16/2008)   Chol: 136 (12/24/2009)   HDL: 52.10 (12/24/2009)   LDL: 66 (12/24/2009)   TG: 89.0 (12/24/2009)  Problem # 3:  FAMILY STRESS (ICD-V61.9) Assessment: New Will follow. Discussed briefly.  Complete Medication List: 1)  Crestor 20 Mg Tabs (Rosuvastatin calcium) .Marland Kitchen.. 1 daily by mouth at bedtime 2)  Hydrochlorothiazide 12.5 Mg Tabs (Hydrochlorothiazide) .Marland Kitchen.. 1 daily by mouth 3)  Bayer Low Strength 81 Mg Tbec (Aspirin) .Marland Kitchen.. 1 daily by mouth 4)  Metoprolol Tartrate 50 Mg Tabs (Metoprolol tartrate) .... One tab by mouth two times a day  Patient Instructions: 1)  RTC Jun with labs prior Prescriptions: METOPROLOL TARTRATE 50 MG TABS (METOPROLOL TARTRATE) one tab by mouth two times a day  #60 x 12   Entered and Authorized by:   Shaune Leeks MD   Signed by:   Shaune Leeks MD on 12/26/2009   Method used:   Electronically to        [facility]* (retail)       [address]       [place], [place]  [address]       Ph: [phone]       Fax: [phone]   RxID:   [id number]   Current Allergies (reviewed today): No known allergies

## 2010-10-22 NOTE — Progress Notes (Signed)
Summary: Alternative for Crestor  Phone Note From Pharmacy Call back at [phone]   [facility]: [facility] Call For: Dr. Hetty Ely  Summary of Call: Pharmacy called and stated that Crestor copay is now $100.00 and the patient is not willing to pay that.  Can the patient be switched to a less expensive alternative?  Maybe Simvastatin.  Please advise.  Initial call taken by: Linde Gillis CMA Duncan Dull),  December 17, 2009 11:37 AM  Follow-up for Phone Call        Pt hasn't been seen in over a year. Have him come in after getting Chol Profile and SGOT, SGPT 272.4. Follow-up by: Shaune Leeks MD,  December 17, 2009 1:20 PM  Additional Follow-up for Phone Call Additional follow up Details #1::        Left message on machine for patient to return call.  Linde Gillis CMA Duncan Dull)  December 17, 2009 1:30 PM   Patient's wife called back, they can not afford the Crestor and have no other means of getting the Rx.  Advised that we do have samples that they could come by and get, one month supply until his f/u scheduled for 12/26/2009, lab appt made for 12/24/2009.  Samples will be left up front.   Additional Follow-up by: Linde Gillis CMA Duncan Dull),  December 17, 2009 2:40 PM

## 2010-10-22 NOTE — Progress Notes (Signed)
Summary: samples of plavix   Phone Note Call from Patient Call back at Home Phone [phone]   Caller: Patient Reason for Call: Talk to Nurse Summary of Call: sample of plavix 75 mg. [place] office told pt to call [address] office  Initial call taken by: Lorne Skeens,  July 04, 2010 10:03 AM  Follow-up for Phone Call        Spoke with pt. Pt. states he called [facility] office for Plavix 75mg  samples and they had only seven pills. Pt. states he is unemployed and can't afford the medication. A 12/75mg  Plavix samples placed at the front desk for pt. to peak up. Pt.aware. Follow-up by: Ollen Gross, RN, BSN,  July 04, 2010 10:38 AM

## 2010-10-22 NOTE — Assessment & Plan Note (Signed)
Summary: BP CHECK / LFW   Nurse Visit   Vital Signs:  Patient profile:   60 year old male Temp:     97.6 degrees F oral Pulse rate:   76 / minute Pulse rhythm:   regular BP sitting:   140 / 84  (left arm) Cuff size:   large  Vitals Entered By: Selena Batten Dance CMA Duncan Dull) (July 30, 2010 10:34 AM)  Allergies: 1)  Effient (Prasugrel Hcl)  Orders Added: 1)  New Patient Level I [99201]  Appended Document: BP CHECK / LFW Medications Added HYDROCHLOROTHIAZIDE 25 MG TABS (HYDROCHLOROTHIAZIDE) take one daily for blood pressure       please review BP meds with patient and correct chart.  he told me he was on 50mg  metoprolol, but in chart we have 25mg  two times a day.  also should be on 12.5mg  HCTZ.  I would like to increase HCTZ to 25mg  daily.  can take 2 daily until runs out and new script will be 25mg  daily. Eustaquio Boyden  MD  July 30, 2010 12:41 PM  Left message for patient to return my call. Kim Dance CMA Duncan Dull)  July 30, 2010 1:22 PM  Spoke with pt and gave instructions.              Lowella Petties CMA, AAMA  July 31, 2010 11:52 AM  Clinical Lists Changes  Medications: Changed medication from HYDROCHLOROTHIAZIDE 12.5 MG  TABS (HYDROCHLOROTHIAZIDE) 1 DAILY BY MOUTH to HYDROCHLOROTHIAZIDE 25 MG TABS (HYDROCHLOROTHIAZIDE) take one daily for blood pressure - Signed Rx of HYDROCHLOROTHIAZIDE 25 MG TABS (HYDROCHLOROTHIAZIDE) take one daily for blood pressure;  #30 x 3;  Signed;  Entered by: Eustaquio Boyden  MD;  Authorized by: Eustaquio Boyden  MD;  Method used: Electronically to [facility] [address], [place], [place]  [address], Ph: [phone], Fax: [phone]    Prescriptions: HYDROCHLOROTHIAZIDE 25 MG TABS (HYDROCHLOROTHIAZIDE) take one daily for blood pressure  #30 x 3   Entered and Authorized by:   Eustaquio Boyden  MD   Signed by:   Eustaquio Boyden  MD on 07/30/2010   Method used:   Electronically to        [facility]* (retail)       [address]       [place], [place]  [address]       Ph: [phone]       Fax: [phone]   RxID:   [id number]

## 2010-10-22 NOTE — Assessment & Plan Note (Signed)
Summary: TRANSFER FROM DR SCHALLER/CLE   Vital Signs:  Patient profile:   60 year old male Weight:      182.25 pounds Temp:     97.9 degrees F oral Pulse rate:   76 / minute Pulse rhythm:   regular BP sitting:   130 / 84  (left arm) Cuff size:   large  Vitals Entered By: Selena Batten Dance CMA Duncan Dull) (April 30, 2010 3:20 PM) CC: Transfer from Dr. Renea Ee care   History of Present Illness: CC: transfer care, f/u CAD and other issues  1. CAD - recent stent place this year.  previously on effient, now on plavix and ASA 325mg .  Tolerating plavix and ASA ok.  no bleeds, no bruising.  2. HLD - on crestor samples 20mg  2/2 financial issues.  3. HTN - on metoprolol and HCTZ, no HA, vision changes, chest pain, tightness, SOB, urinary changes, LE swelling.  Checks BPs at [facility], 120-130/80s.    unemployed x a few months.  causes significant stress.  looking into driving.  wife smoker, EtOHic.  Preventive Screening-Counseling & Management  Alcohol-Tobacco     Alcohol drinks/day: 0     Smoking Status: quit > 6 months     Year Started: 1970s     Year Quit: 2011  Caffeine-Diet-Exercise     Caffeine use/day: 2 cup  Safety-Violence-Falls     Seat Belt Use: yes      Drug Use:  former.        Blood Transfusions:  no.    Current Medications (verified): 1)  Crestor 20 Mg  Tabs (Rosuvastatin Calcium) .Marland Kitchen.. 1 Daily By Mouth At Bedtime 2)  Hydrochlorothiazide 12.5 Mg  Tabs (Hydrochlorothiazide) .Marland Kitchen.. 1 Daily By Mouth 3)  Bayer Low Strength 81 Mg  Tbec (Aspirin) .Marland Kitchen.. 1 Daily By Three Times A Day 4)  Metoprolol Tartrate 25 Mg Tabs (Metoprolol Tartrate) .Marland Kitchen.. 1 By Mouth Two Times A Day 5)  Plavix 75 Mg Tabs (Clopidogrel Bisulfate) .... Take One Tablet By Mouth Daily 6)  Nitrostat 0.4 Mg Subl (Nitroglycerin) .Marland Kitchen.. 1 Tablet Under Tongue At Onset of Chest Pain; You May Repeat Every 5 Minutes For Up To 3 Doses.  Allergies (verified): 1)  Effient (Prasugrel Hcl)  Past History:  Past Medical  History: CAD Hyperlipidemia Hypertension former smoker (quit 01/2009)  Family History: Father: DECEASED AT 46 YOA CANCER OF NECK// MASSIVE STROKES// MI X 2-3  Mother: A 61  CABG X 3  SISTER A 59  CV: + FATHER, +MOTHER,+ SELF HBP: + SELF; +FATHER DM: NEGATIVE GOUT/ARTHRITIS: PROSTATE CANCER:  FATHER NECK CANCER BREAST/OVARIAN/UTERINE CANCER: NEGATIVE COLON CANCER: NEGATIVE DEPRESSION: + SELF SOMETIMES ETOH/DRUG ABUSE: NEGATIVE OTHER +STROKES FATHER:   Social History: Marital Status: Married LIVES WITH WIFE Children: 1 13 YOA Occupation: Unemployed, wants to start driving trucks Smoking Status:  quit > 6 months Caffeine use/day:  2 cup Seat Belt Use:  yes Drug Use:  former Blood Transfusions:  no  Review of Systems       The patient complains of weight gain.  The patient denies anorexia, fever, weight loss, vision loss, decreased hearing, hoarseness, chest pain, syncope, dyspnea on exertion, peripheral edema, prolonged cough, headaches, hemoptysis, abdominal pain, melena, hematochezia, severe indigestion/heartburn, hematuria, incontinence, muscle weakness, suspicious skin lesions, difficulty walking, and testicular masses.    Physical Exam  General:  Well-developed,well-nourished,in no acute distress; alert,appropriate and cooperative throughout examination Head:  Normocephalic and atraumatic without obvious abnormalities. No apparent alopecia or balding. Mouth:  Oral mucosa and  oropharynx without lesions or exudates.  Teeth in good repair. Neck:  No deformities, masses, or tenderness noted. Lungs:  Normal respiratory effort, chest expands symmetrically. Lungs are clear to auscultation, no crackles or wheezes. Heart:  Normal rate and regular rhythm. S1 and S2 normal without gallop, murmur, click, rub or other extra sounds. Abdomen:  Bowel sounds positive,abdomen soft and non-tender without masses, organomegaly or hernias noted. Rectal:  No external abnormalities noted. Normal  sphincter tone. No rectal masses or tenderness.  hemoccult negative. Prostate:  Prostate gland firm and smooth, no enlargement, nodularity, tenderness, mass, asymmetry or induration. Pulses:  2+ periph pulses Extremities:  no edema Skin:  Intact without suspicious lesions or rashes   Impression & Recommendations:  Problem # 1:  HYPERLIPIDEMIA (ICD-272.4) continue crestor.  consider switch to generic (atorvastatin starting 07/2010 vs pravastatin or simvastatin) if unable to afford.  His updated medication list for this problem includes:    Crestor 20 Mg Tabs (Rosuvastatin calcium) .Marland Kitchen... 1 daily by mouth at bedtime  Labs Reviewed: SGOT: 27 (04/25/2010)   SGPT: 51 (04/25/2010)   HDL:52.10 (12/24/2009), 47.8 (10/16/2008)  LDL:66 (12/24/2009), 63 (10/16/2008)  Chol:136 (12/24/2009), 136 (10/16/2008)  Trig:89.0 (12/24/2009), 128 (10/16/2008)  Problem # 2:  HYPERTENSION, BENIGN ESSENTIAL (ICD-401.1) continue current meds. His updated medication list for this problem includes:    Hydrochlorothiazide 12.5 Mg Tabs (Hydrochlorothiazide) .Marland Kitchen... 1 daily by mouth    Metoprolol Tartrate 25 Mg Tabs (Metoprolol tartrate) .Marland Kitchen... 1 by mouth two times a day  BP today: 130/84 Prior BP: 140/92 (02/06/2010)  Labs Reviewed: K+: 4.5 (04/25/2010) Creat: : 0.9 (04/25/2010)   Chol: 136 (12/24/2009)   HDL: 52.10 (12/24/2009)   LDL: 66 (12/24/2009)   TG: 89.0 (12/24/2009)  Problem # 3:  CAD (ICD-414.00) continue current meds. His updated medication list for this problem includes:    Hydrochlorothiazide 12.5 Mg Tabs (Hydrochlorothiazide) .Marland Kitchen... 1 daily by mouth    Aspirin 325 Mg Tabs (Aspirin) ..... One daily    Metoprolol Tartrate 25 Mg Tabs (Metoprolol tartrate) .Marland Kitchen... 1 by mouth two times a day    Plavix 75 Mg Tabs (Clopidogrel bisulfate) .Marland Kitchen... Take one tablet by mouth daily    Nitrostat 0.4 Mg Subl (Nitroglycerin) .Marland Kitchen... 1 tablet under tongue at onset of chest pain; you may repeat every 5 minutes for up to 3  doses.  Labs Reviewed: Chol: 136 (12/24/2009)   HDL: 52.10 (12/24/2009)   LDL: 66 (12/24/2009)   TG: 89.0 (12/24/2009)  Problem # 4:  HYPERGLYCEMIA (ICD-790.29) prediabetes.  continue to monitor for now.  on statin... Labs Reviewed: Creat: 0.9 (04/25/2010)     Problem # 5:  THROMBOCYTOPENIA (ICD-287.5) plt dropping over last few years.  consider checking periph blood smear with CBC next blood draw.  Monitor for bleeding/petechia/ecchymoses (none currently).  Pt on plavix/ASA 325.  Problem # 6:  HYPERBILIRUBINEMIA (ICD-782.4) TBili 1.5.  ?gilbert's  Complete Medication List: 1)  Crestor 20 Mg Tabs (Rosuvastatin calcium) .Marland Kitchen.. 1 daily by mouth at bedtime 2)  Hydrochlorothiazide 12.5 Mg Tabs (Hydrochlorothiazide) .Marland Kitchen.. 1 daily by mouth 3)  Aspirin 325 Mg Tabs (Aspirin) .... One daily 4)  Metoprolol Tartrate 25 Mg Tabs (Metoprolol tartrate) .Marland Kitchen.. 1 by mouth two times a day 5)  Plavix 75 Mg Tabs (Clopidogrel bisulfate) .... Take one tablet by mouth daily 6)  Nitrostat 0.4 Mg Subl (Nitroglycerin) .Marland Kitchen.. 1 tablet under tongue at onset of chest pain; you may repeat every 5 minutes for up to 3 doses.  Other Orders: Hemoccult Cards -3  specimans (take home) ([id number])  Patient Instructions: 1)  Please return in 3 months for follow up blood pressure and cholesterol. 2)  Stool cards given today. 3)  Everything is looking good today.   4)  Call clinic with questions.  Pleasure to meet you today.  Current Allergies (reviewed today): EFFIENT (PRASUGREL HCL)   Prevention & Chronic Care Immunizations   Influenza vaccine: Not documented    Tetanus booster: 04/14/2005: Td   Tetanus booster due: 04/15/2015    Pneumococcal vaccine: Not documented  Colorectal Screening   Hemoccult: negative  (11/17/2008)   Hemoccult action/deferral: Ordered  (04/30/2010)    Colonoscopy: Not documented  Other Screening   PSA: 0.47  (04/25/2010)   PSA due due: 04/26/2011   Smoking status: quit > 6 months   (04/30/2010)  Lipids   Total Cholesterol: 136  (12/24/2009)   LDL: 66  (12/24/2009)   LDL Direct: Not documented   HDL: 52.10  (12/24/2009)   Triglycerides: 89.0  (12/24/2009)    SGOT (AST): 27  (04/25/2010)   SGPT (ALT): 51  (04/25/2010)   Alkaline phosphatase: 67  (04/25/2010)   Total bilirubin: 1.5  (04/25/2010)    Lipid flowsheet reviewed?: Yes   Progress toward LDL goal: At goal  Hypertension   Last Blood Pressure: 130 / 84  (04/30/2010)   Serum creatinine: 0.9  (04/25/2010)   Serum potassium 4.5  (04/25/2010)    Hypertension flowsheet reviewed?: Yes   Progress toward BP goal: At goal  Self-Management Support :   Personal Goals (by the next clinic visit) :      Personal blood pressure goal: 140/90  (04/30/2010)     Personal LDL goal: 70  (04/30/2010)    Hypertension self-management support: Not documented    Lipid self-management support: Not documented    Nursing Instructions: Provide Hemoccult cards with instructions (see order)   Appended Document: Orders Update     Clinical Lists Changes  Orders: Added new Service order of Hemoccult Guaiac-1 spec.(in office) [id number]) - Signed

## 2010-10-22 NOTE — Letter (Signed)
Summary: Insurance underwriter Form   Imported By: Lanelle Bal 05/22/2010 10:58:56  _____________________________________________________________________  External Attachment:    Type:   Image     Comment:   External Document

## 2010-10-22 NOTE — Progress Notes (Signed)
Summary: iFOB  Phone Note Call from Patient   Caller: Patient Call For: [facility] Summary of Call: Patient received my message reguarding ifob  testing and cost. He will do the test. I will order and send to the [facility] Initial call taken by: [name],  May 09, 2010 3:07 PM  Follow-up for Phone Call        thanks. Follow-up by: Eustaquio Boyden  MD,  May 09, 2010 3:14 PM

## 2010-10-22 NOTE — Letter (Signed)
Summary: Nadara Eaton letter  [facility]  [address] [place], [place] [address]   Phone: [phone]  Fax: [phone]       04/30/2010 MRN: [id number]  Joseph Shea [address] [place], [place]  [address]  Dear Mr. Renae [facility] [place], and  announce the retirement of Arta Silence, M.D., from full-time practice at the [facility] office effective March 21, 2010 and his plans of returning part-time.  It is important to Dr. Hetty Ely and to our practice that you understand that [facility] has seven physicians in our office for your health care needs.  We will continue to offer the same exceptional care that you have today.    Dr. Hetty Ely has spoken to many of you about his plans for retirement and returning part-time in the fall.   We will continue to work with you through the transition to schedule appointments for you in the office and meet the high standards that [facility] is committed to.   Again, it is with great pleasure that we share the news that Dr. Hetty Ely will return to [facility] in October of 2011 with a reduced schedule.    If you have any questions, or would like to request an appointment with one of our physicians, please call us at [phone] and press the option for Scheduling an appointment.  We take pleasure in providing you with excellent patient care and look forward to seeing you at your next office visit.  Our [facility] Physicians are:  Tillman Abide, M.D. Laurita Quint, M.D. Roxy Manns, M.D. Kerby Nora, M.D. Hannah Beat, M.D. Ruthe Mannan, M.D. We proudly welcomed Raechel Ache, M.D. and Eustaquio Boyden, M.D. to the practice in July/August 2011.  Sincerely,  [facility]

## 2010-10-22 NOTE — Progress Notes (Signed)
Summary: SAMPLES Plavix (none)  Phone Note Call from Patient   Summary of Call: PT WOULD LIKE SAMPLES OF PLAVIX.  Fuller Song HIM [phone] Initial call taken by: Park Breed,  April 12, 2010 11:20 AM  Follow-up for Phone Call        No samples available here. Patient wants to call [place] to see if samples before a refill called in. Follow-up by: Bishop Dublin, CMA,  April 12, 2010 12:03 PM

## 2010-10-22 NOTE — Progress Notes (Signed)
Summary: SAMPLES   Phone Note Call from Patient Call back at [phone]   Caller: SELF Call For: [facility] Summary of Call: PT WOULD LIKE SAMPLES OF PLAVIX 10 MG-ONLY HAS 1 LEFT Initial call taken by: Harlon Flor,  March 06, 2010 10:20 AM  Follow-up for Phone Call        PT IS CALLING BACK ABOUT THE SAMPLES-WOULD LIKE A CALL BEFORE HIS LUNCH BREAK AT 1:00-THAT IS THE ONLY TIME THAT HE WOULD BE ABLE TO COME BY TODAY TO PICK THEM UP, AS HE DOES NOT GET OFF OF WORK UNTIL 6:00 Follow-up by: Harlon Flor,  March 06, 2010 12:05 PM    Prescriptions: PLAVIX 75 MG TABS (CLOPIDOGREL BISULFATE) Take one tablet by mouth daily  #32 x 0   Entered by:   Benedict Needy, RN   Authorized by:   Dossie Arbour MD   Signed by:   Benedict Needy, RN on 03/06/2010   Method used:   Samples Given   RxID:   [id number]

## 2010-10-22 NOTE — Assessment & Plan Note (Signed)
Summary: EC6/AMD  Medications Added BAYER LOW STRENGTH 81 MG  TBEC (ASPIRIN) 1 DAILY BY three times a day METOPROLOL TARTRATE 25 MG TABS (METOPROLOL TARTRATE) 1 by mouth two times a day EFFIENT 10 MG TABS (PRASUGREL HCL) Take one tablet by mouth daily - DIDN'T TAKE TODAY NITROSTAT 0.4 MG SUBL (NITROGLYCERIN) 1 tablet under tongue at onset of chest pain; you may repeat every 5 minutes for up to 3 doses.      Allergies Added: NKDA  Visit Type:  EC6 Referring Provider:  Excell Seltzer, MD Primary Provider:  Shaune Leeks MD  CC:  ? reaction to effient / use to be on Plavix but came off a while ago. Shortness of breath thinks may be due to meidcation. Fatique and tired. Marland Kitchen  History of Present Illness: 60 year old male with a history of coronary artery disease, 2 stents placed in 2004 with recent intervention in May 2004 in-stent restenosis of his circumflex stent with DES stent placed and returned for followup. Stent was placed by radial approach on the right.  He states that he has not felt well the past several weeks since the stent was placed. He has a copy of the side effects of effient and wonders if it might be the medication. He has weakness, some GI upset and feels nervous. Of note he is having significant financial troubles as he is in between jobs and recently started working for [employer] in [place]. He is also trying to stop smoking and wonders if it could be withdrawal effects. He has some twinges in his chest but no significant pain.  Cardiac Cath 01/24/2010 1. Minor nonobstructive left anterior descending stenosis. 2. Severe in-stent restenosis to the left circumflex with successful     percutaneous coronary intervention using a drug-eluting stent. 3. Nonobstructive right coronary artery stenosis. 4. Normal left ventricular function.    Preventive Screening-Counseling & Management  Alcohol-Tobacco     Smoking Status: quit  Current Problems (verified): 1)  Family  Stress  (ICD-V61.9) 2)  Special Screening Malig Neoplasms Other Sites  (ICD-V76.49) 3)  Hyperbilirubinemia  (ICD-782.4) 4)  Hyperglycemia  (ICD-790.29) 5)  Special Screening Malignant Neoplasm of Prostate  (ICD-V76.44) 6)  Hyperlipidemia  (ICD-272.4) 7)  Hypertension, Benign Essential  (ICD-401.1) 8)  Cad  (ICD-414.00)  Current Medications (verified): 1)  Crestor 20 Mg  Tabs (Rosuvastatin Calcium) .Marland Kitchen.. 1 Daily By Mouth At Bedtime 2)  Hydrochlorothiazide 12.5 Mg  Tabs (Hydrochlorothiazide) .Marland Kitchen.. 1 Daily By Mouth 3)  Bayer Low Strength 81 Mg  Tbec (Aspirin) .Marland Kitchen.. 1 Daily By Three Times A Day 4)  Metoprolol Tartrate 25 Mg Tabs (Metoprolol Tartrate) .Marland Kitchen.. 1 By Mouth Two Times A Day 5)  Effient 10 Mg Tabs (Prasugrel Hcl) .... Take One Tablet By Mouth Daily - Didn't Take Today 6)  Nitrostat 0.4 Mg Subl (Nitroglycerin) .Marland Kitchen.. 1 Tablet Under Tongue At Onset of Chest Pain; You May Repeat Every 5 Minutes For Up To 3 Doses.  Allergies (verified): No Known Drug Allergies  Past History:  Past Medical History: CAD Hyperlipidemia Hypertension  Social History: Smoking Status:  quit  Review of Systems  The patient denies fever, weight loss, weight gain, vision loss, decreased hearing, hoarseness, chest pain, syncope, dyspnea on exertion, peripheral edema, prolonged cough, abdominal pain, incontinence, muscle weakness, depression, and enlarged lymph nodes.         weakness, malaise, GI upset  Vital Signs:  Patient profile:   60 year old male Height:      69 inches Weight:  182.50 pounds BMI:     27.05 Pulse rate:   56 / minute Pulse rhythm:   regular BP sitting:   140 / 92  (left arm) Cuff size:   regular  Vitals Entered By: Mercer Pod (Feb 06, 2010 10:48 AM)  Physical Exam  General:  well-appearing man in no apparent distress, appears to be moderately anxious and sweaty, HEENT is benign, neck is supple no JVP or carotid bruits heart sounds are regular with S1-S2 no murmurs  appreciated lungs are clear to auscultation with no wheezes Rales, abdominal exam is benign, no significant lower extremity edema, neurologic exam is nonfocal, skin is warm and dry. Pulses are equal and symmetrical in his upper and lower extremities.   Impression & Recommendations:  Problem # 1:  CAD (ICD-414.00) recent stent placed to his in-stent restenosis of the circumflex. He has malaise and he is uncertain why. He wonders if it might be the effient.   We will try him on Plavix with his aspirin 325 mg daily for several weeks to see if he feels better on a different medication. If he feels the same, this may be due to smoking withdrawal or underlying anxiety. I have asked him to keep in touch with [place] over the next week or so. Ee cannot definitively rule out a problem with stent. We did give him a co-pay card for effient as this might be cheaper than Plavix if he decides to go back on the medication.  We have given him some samples of Crestor 20 mg daily as he is having a difficult time affording his medications.  His updated medication list for this problem includes:    Bayer Low Strength 81 Mg Tbec (Aspirin) .Marland Kitchen... 1 daily by three times a day    Metoprolol Tartrate 25 Mg Tabs (Metoprolol tartrate) .Marland Kitchen... 1 by mouth two times a day    Effient 10 Mg Tabs (Prasugrel hcl) .Marland Kitchen... Take one tablet by mouth daily - didn't take today    Nitrostat 0.4 Mg Subl (Nitroglycerin) .Marland Kitchen... 1 tablet under tongue at onset of chest pain; you may repeat every 5 minutes for up to 3 doses.  Problem # 2:  HYPERLIPIDEMIA (ICD-272.4) Medications well-controlled on his current dose of Crestor 20 mg daily.  His updated medication list for this problem includes:    Crestor 20 Mg Tabs (Rosuvastatin calcium) .Marland Kitchen... 1 daily by mouth at bedtime

## 2010-10-22 NOTE — Assessment & Plan Note (Signed)
Summary: DOT physical   Vital Signs:  Patient profile:   60 year old male Height:      69 inches Weight:      183.25 pounds Temp:     97.7 degrees F oral Pulse rate:   74 / minute Pulse rhythm:   regular BP sitting:   142 / 82  (left arm) Cuff size:   large  Vitals Entered By: Selena Batten Dance CMA Duncan Dull) (May 15, 2010 12:10 PM)  Serial Vital Signs/Assessments:  Time      Position  BP       Pulse  Resp  Temp     By                     138/98                         Joseph Boyden  MD  CC: DOT physical  Vision Screening:Left eye with correction: 20 / 25 Right eye with correction: 20 / 20 Both eyes with correction: 20 / 20        Vision Entered By: Selena Batten Dance CMA Duncan Dull) (May 15, 2010 12:13 PM)  Hearing Screen 25db HL: Left  500 hz: 25db 1000 hz: 25db 2000 hz: 25db 4000 hz: No Response Right  500 hz: 25db 1000 hz: 25db 2000 hz: 25db 4000 hz: No Response Audiometry Comment: Repeated test x3 with same results    History of Present Illness: CC: here for DOT physical  Drank 2 cups coffee today.  also stressed because late to appointment.  attributes this to elevated BP.  overall doing well.    thromboctopenia - no bleeding, bruising.  on plavix, ASA.  Allergies: 1)  Effient (Prasugrel Hcl) PMH-FH-SH reviewed for relevance  Review of Systems       per HPI and last visit.   Impression & Recommendations:  Problem # 1:  HYPERTENSION, BENIGN ESSENTIAL (ICD-401.1) attributes elevated bp to extra cup coffee this am as well as recent ride in motorcycle and stress of arriving late to appt.  to return tomorrow for bp check.  Depending on that reading, will certify for 1 vs 2 years .  His updated medication list for this problem includes:    Hydrochlorothiazide 12.5 Mg Tabs (Hydrochlorothiazide) .Marland Kitchen... 1 daily by mouth    Metoprolol Tartrate 25 Mg Tabs (Metoprolol tartrate) .Marland Kitchen... 1 by mouth two times a day  BP today: 142/82 Prior BP: 130/84 (04/30/2010)  Labs  Reviewed: K+: 4.5 (04/25/2010) Creat: : 0.9 (04/25/2010)   Chol: 136 (12/24/2009)   HDL: 52.10 (12/24/2009)   LDL: 66 (12/24/2009)   TG: 89.0 (12/24/2009)  Orders: Work-Related DOT ([id number])  Problem # 2:  THROMBOCYTOPENIA (ICD-287.5) discussed and went over ercent labwork showing plt trending downwards.  pt declines further blood work today to monitor, will obtain CBC and periph smear next visit in 1-2 months for f/u.  advised to keep eye out for bruising and bleeding, RTC if case.  Orders: Work-Related DOT [id number])  Problem # 3:  PHYSICAL EXAMINATION (ICD-V70.0) Reviewed preventive care protocols, scheduled due services, and updated immunizations.  recent PSA/DRE WNL.  Orders: Work-Related DOT [phone])  Complete Medication List: 1)  Crestor 20 Mg Tabs (Rosuvastatin calcium) .Marland Kitchen.. 1 daily by mouth at bedtime 2)  Hydrochlorothiazide 12.5 Mg Tabs (Hydrochlorothiazide) .Marland Kitchen.. 1 daily by mouth 3)  Aspirin 325 Mg Tabs (Aspirin) .... One daily 4)  Metoprolol Tartrate 25 Mg Tabs (Metoprolol tartrate) .Marland Kitchen.. 1 by  mouth two times a day 5)  Plavix 75 Mg Tabs (Clopidogrel bisulfate) .... Take one tablet by mouth daily 6)  Nitrostat 0.4 Mg Subl (Nitroglycerin) .Marland Kitchen.. 1 tablet under tongue at onset of chest pain; you may repeat every 5 minutes for up to 3 doses.  Patient Instructions: 1)  Physical today. 2)  Retrn tomorrow for BP check with nurses, we will fill out form based on that visit. 3)  Good to see you today.  See you in 2 months.  Laboratory Results   Urine Tests  Date/Time Received: May 15, 2010 12:11 PM  Date/Time Reported: May 15, 2010 12:11 PM   Routine Urinalysis   Color: yellow Appearance: Clear Glucose: negative   (Normal Range: Negative) Bilirubin: negative   (Normal Range: Negative) Ketone: negative   (Normal Range: Negative) Spec. Gravity: >=1.030   (Normal Range: 1.003-1.035) Blood: negative   (Normal Range: Negative) pH: 6.0   (Normal Range: 5.0-8.0) Protein:  negative   (Normal Range: Negative) Urobilinogen: 0.2   (Normal Range: 0-1) Nitrite: negative   (Normal Range: Negative) Leukocyte Esterace: negative   (Normal Range: Negative)         Prevention & Chronic Care Immunizations   Influenza vaccine: Not documented    Tetanus booster: 04/14/2005: Td   Tetanus booster due: 04/15/2015    Pneumococcal vaccine: Not documented  Colorectal Screening   Hemoccult: negative  (11/17/2008)   Hemoccult action/deferral: Ordered  (04/30/2010)    Colonoscopy: Not documented  Other Screening   PSA: 0.47  (04/25/2010)   PSA due due: 04/26/2011   Smoking status: quit > 6 months  (04/30/2010)  Lipids   Total Cholesterol: 136  (12/24/2009)   LDL: 66  (12/24/2009)   LDL Direct: Not documented   HDL: 52.10  (12/24/2009)   Triglycerides: 89.0  (12/24/2009)    SGOT (AST): 27  (04/25/2010)   SGPT (ALT): 51  (04/25/2010)   Alkaline phosphatase: 67  (04/25/2010)   Total bilirubin: 1.5  (04/25/2010)  Hypertension   Last Blood Pressure: 142 / 82  (05/15/2010)   Serum creatinine: 0.9  (04/25/2010)   Serum potassium 4.5  (04/25/2010)  Self-Management Support :   Personal Goals (by the next clinic visit) :      Personal blood pressure goal: 140/90  (04/30/2010)     Personal LDL goal: 70  (04/30/2010)    Hypertension self-management support: Not documented    Lipid self-management support: Not documented     Appended Document: PE (forgot to input)     Clinical Lists Changes  Observations: Added new observation of PSYCH COMM: Cognition and judgment appear intact. Alert and cooperative with normal attention span and concentration. No apparent delusions, illusions, hallucinations (05/15/2010 13:52) Added new observation of SKIN SQ INSP: Intact without suspicious lesions or rashes (05/15/2010 13:52) Added new observation of NEURO EXAM: CN grossly intact, station/gait intact (05/15/2010 13:52) Added new observation of EXTREMITIES: no  edema (05/15/2010 13:52) Added new observation of PEDAL PULSE: 2+ periph pulses (05/15/2010 13:52) Added new observation of ABDOMEN EXAM: Bowel sounds positive,abdomen soft and non-tender without masses, organomegaly or hernias noted. (05/15/2010 13:52) Added new observation of HEART EXAM: Normal rate and regular rhythm. S1 and S2 normal without gallop, murmur, click, rub or other extra sounds. (05/15/2010 13:52) Added new observation of LUNG EXAM: Normal respiratory effort, chest expands symmetrically. Lungs are clear to auscultation, no crackles or wheezes. (05/15/2010 13:52) Added new observation of NECK EXAM: No deformities, masses, or tenderness noted. (05/15/2010 13:52) Added new observation of  EYE EXAM: No corneal or conjunctival inflammation noted. EOMI. Perrla. (05/15/2010 13:52) Added new observation of ORAL EXAM: Oral mucosa and oropharynx without lesions or exudates.  Teeth in good repair. (05/15/2010 13:52) Added new observation of HD/FACE INSP: Normocephalic and atraumatic without obvious abnormalities. No apparent alopecia or balding. (05/15/2010 13:52) Added new observation of PEADULT: Joseph Boyden  MD ~General`Gen appear ~Head`hd/face insp ~Mouth`Oral exam ~Eyes`Eye exam ~Neck`NECK EXAM ~Lungs`lung exam ~Heart`Heart exam ~Abdomen`Abdomen exam ~Pulses`pedal pulse ~Extremities`Extremities ~Neurologic`Neuro exam ~Skin`skin sq insp ~Psych`psych comm (05/15/2010 13:52) Added new observation of GEN APPEAR: Well-developed,well-nourished,in no acute distress; alert,appropriate and cooperative throughout examination (05/15/2010 13:52)        Physical Exam  General:  Well-developed,well-nourished,in no acute distress; alert,appropriate and cooperative throughout examination Head:  Normocephalic and atraumatic without obvious abnormalities. No apparent alopecia or balding. Eyes:  No corneal or conjunctival inflammation noted. EOMI. Perrla. Mouth:  Oral mucosa and oropharynx without  lesions or exudates.  Teeth in good repair. Neck:  No deformities, masses, or tenderness noted. Lungs:  Normal respiratory effort, chest expands symmetrically. Lungs are clear to auscultation, no crackles or wheezes. Heart:  Normal rate and regular rhythm. S1 and S2 normal without gallop, murmur, click, rub or other extra sounds. Abdomen:  Bowel sounds positive,abdomen soft and non-tender without masses, organomegaly or hernias noted. Pulses:  2+ periph pulses Extremities:  no edema Neurologic:  CN grossly intact, station/gait intact Skin:  Intact without suspicious lesions or rashes Psych:  Cognition and judgment appear intact. Alert and cooperative with normal attention span and concentration. No apparent delusions, illusions, hallucinations  Appended Document: DOT physical  Pt does have recent stent placed 01/2010, however asxs since and with cath 01/2010 showing normal LV function.  Given his history, will need clearance by cardiology prior to DOT certification, and likely only 1 year at a time.  informed patient of such. Joseph Boyden  MD  May 15, 2010 2:08 PM   Clinical Lists Changes

## 2010-10-22 NOTE — Progress Notes (Signed)
Summary: SAMPLES   Phone Note Call from Patient Call back at Home Phone [phone]   Caller: SELF Call For: [facility] Summary of Call: PT WOULD LIKE PLAVIX SAMPLES-PT IS UNEMPLOYED Initial call taken by: Harlon Flor,  June 13, 2010 10:41 AM  Follow-up for Phone Call        Phone Call Completed Follow-up by: Bishop Dublin, CMA,  June 13, 2010 3:35 PM  Additional Follow-up for Phone Call Additional follow up Details #1::        plavix samples given. Additional Follow-up by: Bishop Dublin, CMA,  June 13, 2010 3:36 PM

## 2010-10-22 NOTE — Assessment & Plan Note (Signed)
Summary: BP CHECK/ GUTIERREZ/DLO   Nurse Visit  Returned form, advised to call cards for approval, form scanned into chart. Eustaquio Boyden  MD  May 16, 2010 8:40 AM  Vital Signs:  Patient profile:   60 year old male Height:      69 inches Weight:      183.25 pounds Temp:     98.4 degrees F oral Pulse rate:   74 / minute Pulse rhythm:   regular BP sitting:   130 / 84  (left arm) Cuff size:   large  Vitals Entered By: Selena Batten Dance CMA (AAMA) (May 16, 2010 8:30 AM) CC: B/P check   Allergies: 1)  Effient (Prasugrel Hcl)

## 2010-10-22 NOTE — Progress Notes (Signed)
Summary: SAMPLES   Phone Note Call from Patient Call back at Home Phone [phone]   Caller: SELF Call For: [facility] Summary of Call: PT WOULD LIKE SAMPLES OF PLAVIX Initial call taken by: Harlon Flor,  July 04, 2010 9:16 AM  Follow-up for Phone Call        gave samples of plavix for 7 day supply. Follow-up by: Bishop Dublin, CMA,  July 04, 2010 9:58 AM

## 2010-10-22 NOTE — Progress Notes (Signed)
Summary: FYI IFOB not returned  Phone Note Outgoing Call   Call placed by: Celso Sickle Call placed to: Patient Summary of Call: FYI Recieved notice that IFOB kit has not been returned to lab. I spoke to pt and he states he mailed it 3 weeks ago. I have contacted lab they will verifywhether or not they have it, and will let us know if he needs to repeat it or not. Thanks Initial call taken by: Liane Comber CMA Duncan Dull),  June 13, 2010 10:15 AM  Follow-up for Phone Call        thanks. Follow-up by: Eustaquio Boyden  MD,  June 13, 2010 10:30 AM

## 2010-10-22 NOTE — Miscellaneous (Signed)
   Clinical Lists Changes  Observations: Added new observation of HEMOCULTDUE: 06/2011 (06/20/2010 9:28) Added new observation of HEMOCCULT: negative (06/19/2010 9:29)      Preventive Care Screening  Hemoccult:    Date:  06/19/2010    Next Due:  06/2011    Results:  negative

## 2010-10-22 NOTE — Letter (Signed)
Summary: PHI  PHI   Imported By: Harlon Flor 02/07/2010 12:02:17  _____________________________________________________________________  External Attachment:    Type:   Image     Comment:   External Document

## 2010-10-24 NOTE — Assessment & Plan Note (Signed)
Summary: F6M/AMD  Medications Added CIALIS 5 MG TABS (TADALAFIL)       Allergies Added:   Visit Type:  Follow-up Referring Provider:  Excell Seltzer, MD Primary Provider:  Eustaquio Boyden  MD  CC:  "Doing well" Denies chest pain, SOB, and and palpitations.  History of Present Illness: 60 year old male with a history of coronary artery disease, 2 stents placed in 2004 with recent intervention in May 2011 for in-stent restenosis of his circumflex stent with DES stent placed who returns for followup.   overall he has been feeling well with no symptoms of chest pain or shortness of breath. He is active, does not participate in an exercise program though has no complaints. He did not feel well on effient and change to Plavix and has had no further problems.  he does report having problems with erectile dysfunction and wonders if this could be secondary to stress in his life.  Cardiac Cath 01/24/2010 1. Minor nonobstructive left anterior descending stenosis. 2. Severe in-stent restenosis to the left circumflex with successful     percutaneous coronary intervention using a drug-eluting stent. 3. Nonobstructive right coronary artery stenosis. 4. Normal left ventricular function.  EKG shows normal sinus rhythm with rate 63 beats per minute, no significant ST or T wave changes    Preventive Screening-Counseling & Management  Alcohol-Tobacco     Smoking Status: never  Caffeine-Diet-Exercise     Does Patient Exercise: no      Drug Use:  no.    Current Medications (verified): 1)  Crestor 20 Mg  Tabs (Rosuvastatin Calcium) .Marland Kitchen.. 1 Daily By Mouth At Bedtime 2)  Hydrochlorothiazide 25 Mg Tabs (Hydrochlorothiazide) .... Take One Daily For Blood Pressure 3)  Aspirin 325 Mg Tabs (Aspirin) .... One Daily 4)  Metoprolol Tartrate 25 Mg Tabs (Metoprolol Tartrate) .Marland Kitchen.. 1 By Mouth Two Times A Day 5)  Plavix 75 Mg Tabs (Clopidogrel Bisulfate) .... Take One Tablet By Mouth Daily 6)  Nitrostat 0.4 Mg  Subl (Nitroglycerin) .Marland Kitchen.. 1 Tablet Under Tongue At Onset of Chest Pain; You May Repeat Every 5 Minutes For Up To 3 Doses.  Allergies (verified): 1)  Effient (Prasugrel Hcl)  Past History:  Past Medical History: Last updated: 08/20/2010 CAD Hyperlipidemia Hypertension Prediabetes thrombocytopenia former smoker (quit 01/2010)  Past Surgical History: Last updated: 01/25/2010 THIRD DEGREE BURN TO LAT L KNEE W/ SKIN GRAFT  6YOA R HERNIORRAPHY 1996 CATH  W/STENTS X 2 : 03/2003 CATH W/PTCA (2 WEEKS AFTER ABOVE) 04/2003 STRESS MYOVIEW -- NEGATIVE EF 52%: 03/2005 CATH  W/PTCA AND STENT 01/23/2010  Family History: Last updated: 2010/05/21 Father: DECEASED AT 55 YOA CANCER OF NECK// MASSIVE STROKES// MI X 2-3  Mother: A 58  CABG X 3  SISTER A 59  CV: + FATHER, +MOTHER,+ SELF HBP: + SELF; +FATHER DM: NEGATIVE GOUT/ARTHRITIS: PROSTATE CANCER:  FATHER NECK CANCER BREAST/OVARIAN/UTERINE CANCER: NEGATIVE COLON CANCER: NEGATIVE DEPRESSION: + SELF SOMETIMES ETOH/DRUG ABUSE: NEGATIVE OTHER +STROKES FATHER:   Social History: Last updated: 09/20/2010 Marital Status: Married LIVES WITH WIFE Children: 1 13 YOA Occupation: Unemployed, wants to start driving trucks Tobacco Use - No.  Alcohol Use - no Regular Exercise - no Drug Use - no  Risk Factors: Alcohol Use: 0 (2010-05-21) Caffeine Use: 2 cup (May 21, 2010) Exercise: no (09/20/2010)  Risk Factors: Smoking Status: never (09/20/2010) Packs/Day: QUIT 2004//601 (10/19/2008) Cans of tobacco/wk: no (10/19/2008) Passive Smoke Exposure: yes (10/19/2008)  Social History: Marital Status: Married LIVES WITH WIFE Children: 1 13 YOA Occupation: Unemployed, wants to start driving  trucks Tobacco Use - No.  Alcohol Use - no Regular Exercise - no Drug Use - no Smoking Status:  never Drug Use:  no  Review of Systems  The patient denies fever, weight loss, weight gain, vision loss, decreased hearing, hoarseness, chest pain, syncope,  dyspnea on exertion, peripheral edema, prolonged cough, abdominal pain, incontinence, muscle weakness, depression, and enlarged lymph nodes.         Erectile dysfunction  Vital Signs:  Patient profile:   60 year old male Height:      69 inches Weight:      186.25 pounds BMI:     27.60 Pulse rate:   63 / minute BP sitting:   122 / 70  (left arm) Cuff size:   large  Vitals Entered By: Lysbeth Galas CMA (September 20, 2010 10:03 AM)  Physical Exam  General:  Well developed, well nourished, in no acute distress. Head:  normocephalic and atraumatic Neck:  Neck supple, no JVD. No masses, thyromegaly or abnormal cervical nodes. Lungs:  Clear bilaterally to auscultation and percussion. Heart:  Non-displaced PMI, chest non-tender; regular rate and rhythm, S1, S2 without murmurs, rubs or gallops. Carotid upstroke normal, no bruit.  Pedals normal pulses. No edema, no varicosities. Abdomen:  Bowel sounds positive; abdomen soft and non-tender without masses Msk:  Back normal, normal gait. Muscle strength and tone normal. Pulses:  pulses normal in all 4 extremities Extremities:  No clubbing or cyanosis. Neurologic:  Alert and oriented x 3.   Impression & Recommendations:  Problem # 1:  CAD (ICD-414.00) no current symptoms of angina. No further testing at this time. We will continue him on his current medication regimen.  His updated medication list for this problem includes:    Aspirin 325 Mg Tabs (Aspirin) ..... One daily    Metoprolol Tartrate 25 Mg Tabs (Metoprolol tartrate) .Marland Kitchen... 1 by mouth two times a day    Plavix 75 Mg Tabs (Clopidogrel bisulfate) .Marland Kitchen... Take one tablet by mouth daily    Nitrostat 0.4 Mg Subl (Nitroglycerin) .Marland Kitchen... 1 tablet under tongue at onset of chest pain; you may repeat every 5 minutes for up to 3 doses.  Problem # 2:  HYPERLIPIDEMIA (ICD-272.4)  Cholesterol is at goal and we will continue him on Crestor.  His updated medication list for this problem  includes:    Crestor 20 Mg Tabs (Rosuvastatin calcium) .Marland Kitchen... 1 daily by mouth at bedtime  Problem # 3:  HYPERTENSION, BENIGN ESSENTIAL (ICD-401.1) blood pressure is well controlled on today's visit. We did discuss changing his HCTZ to losartan given symptoms of erectile dysfunction. He would like to hold off on this change at this time.  His updated medication list for this problem includes:    Hydrochlorothiazide 25 Mg Tabs (Hydrochlorothiazide) .Marland Kitchen... Take one daily for blood pressure    Aspirin 325 Mg Tabs (Aspirin) ..... One daily    Metoprolol Tartrate 25 Mg Tabs (Metoprolol tartrate) .Marland Kitchen... 1 by mouth two times a day  Problem # 4:  ERECTILE DYSFUNCTION, SECONDARY TO MEDICATION (ICD-607.84) Symptoms likely secondary to stress, possibly secondary to medication and underlying vascular disease. We have given him some samples of cialis to try.  Patient Instructions: 1)  Your physician recommends that you schedule a follow-up appointment in: 1 year 2)  Your physician recommends that you continue on your current medications as directed. Please refer to the Current Medication list given to you today. Prescriptions: CIALIS 5 MG TABS (TADALAFIL)   #30 x 0  Entered by:   Lanny Hurst RN   Authorized by:   Dossie Arbour MD   Signed by:   Lanny Hurst RN on 09/20/2010   Method used:   Print then Give to Patient   RxID:   [id number]

## 2010-10-30 NOTE — Progress Notes (Signed)
Summary: samples of crestor given  Phone Note Call from Patient   Caller: Patient Call For: Eustaquio Boyden  MD Summary of Call: Pt requests samples of crestor, # 4 boxes of 7 each given.  Lot [id number], exp 10/14. Initial call taken by: Lowella Petties CMA, AAMA,  October 22, 2010 11:32 AM  Follow-up for Phone Call        thanks. Follow-up by: Eustaquio Boyden  MD,  October 22, 2010 12:29 PM    New/Updated Medications: CRESTOR 20 MG  TABS (ROSUVASTATIN CALCIUM) 1 daily by mouth at bedtime

## 2010-11-07 ENCOUNTER — Telehealth: Payer: Self-pay | Admitting: Cardiovascular Disease

## 2010-11-13 NOTE — Progress Notes (Signed)
Summary: Plavix   Phone Note Call from Patient   Caller: Spouse Call For: Nurse Summary of Call: Pt is supposed to be taking Plavix. Pt is using [employer] for insurance and his insurance doesn't cover this medication.Pt hasn't taken this medication for a month and half.  Pt has a 2 week supply of Plavix left up front for him. Also a patient assistance form was left up front for him to fill out and notified wife to bring it back up here and we will send it in for her. Initial call taken by: Lysbeth Galas CMA,  November 07, 2010 9:25 AM

## 2010-12-10 LAB — CBC
HCT: 44.7 % (ref 39.0–52.0)
HCT: 48.1 % (ref 39.0–52.0)
MCHC: 35.7 g/dL (ref 30.0–36.0)
MCV: 95.1 fL (ref 78.0–100.0)
Platelets: 112 10*3/uL — ABNORMAL LOW (ref 150–400)
Platelets: 125 10*3/uL — ABNORMAL LOW (ref 150–400)
RDW: 12.8 % (ref 11.5–15.5)
RDW: 13 % (ref 11.5–15.5)

## 2010-12-10 LAB — CARDIAC PANEL(CRET KIN+CKTOT+MB+TROPI)
CK, MB: 2.9 ng/mL (ref 0.3–4.0)
Troponin I: 0.13 ng/mL — ABNORMAL HIGH (ref 0.00–0.06)

## 2010-12-10 LAB — DIFFERENTIAL
Basophils Absolute: 0 10*3/uL (ref 0.0–0.1)
Basophils Relative: 0 % (ref 0–1)
Eosinophils Relative: 1 % (ref 0–5)
Lymphocytes Relative: 20 % (ref 12–46)
Neutro Abs: 4.2 10*3/uL (ref 1.7–7.7)

## 2010-12-10 LAB — COMPREHENSIVE METABOLIC PANEL
ALT: 42 U/L (ref 0–53)
AST: 29 U/L (ref 0–37)
Albumin: 3.7 g/dL (ref 3.5–5.2)
Alkaline Phosphatase: 63 U/L (ref 39–117)
Chloride: 103 mEq/L (ref 96–112)
Potassium: 4.1 mEq/L (ref 3.5–5.1)
Sodium: 134 mEq/L — ABNORMAL LOW (ref 135–145)
Total Bilirubin: 1.3 mg/dL — ABNORMAL HIGH (ref 0.3–1.2)
Total Protein: 5.8 g/dL — ABNORMAL LOW (ref 6.0–8.3)

## 2010-12-10 LAB — POCT CARDIAC MARKERS
CKMB, poc: 2.6 ng/mL (ref 1.0–8.0)
Troponin i, poc: <0.05 ng/mL (ref 0.00–0.09)
Troponin i, poc: <0.05 ng/mL (ref 0.00–0.09)

## 2010-12-10 LAB — BASIC METABOLIC PANEL
BUN: 15 mg/dL (ref 6–23)
Calcium: 9.1 mg/dL (ref 8.4–10.5)
GFR calc non Af Amer: >60 mL/min (ref >60)
Glucose, Bld: 131 mg/dL — ABNORMAL HIGH (ref 70–99)

## 2010-12-16 ENCOUNTER — Telehealth: Payer: Self-pay | Admitting: Cardiovascular Disease

## 2010-12-16 NOTE — Telephone Encounter (Signed)
Pt r'cvd a letter denying patient assistance for the medication from [place].  Would like to know what to do now.

## 2010-12-18 ENCOUNTER — Telehealth: Payer: Self-pay

## 2010-12-18 NOTE — Telephone Encounter (Signed)
Spoke with patient regarding patient assistance for plavix; he was denied. He can't afford plavix and has been without it for three days.  I did give him samples for a two week supply.  What do you recommend for patient.

## 2010-12-18 NOTE — Telephone Encounter (Signed)
Stent place May 2011. Ideally would like to keep going until May. It also goes generic in May We can try to provide some samples. Maybe he could buy one month. Generic mid MAY

## 2010-12-20 NOTE — Telephone Encounter (Signed)
Patient notified plavix will go generic in May.  Told the patient he will need to be on plavix at least until May 2012; needs to be on plavix for 1 year.  He will call if needs a refill or any samples.

## 2010-12-20 NOTE — Telephone Encounter (Signed)
Notified patient need to be on the plavix until May 2012.  Told we can try to provide samples up until May but very unlikely.  Told the patient plavix will go generic in May and would be cheaper for at least the month of May.  The patient will call if needs a refill or needs samples.

## 2010-12-21 ENCOUNTER — Other Ambulatory Visit: Payer: Self-pay | Admitting: Family Medicine

## 2010-12-23 ENCOUNTER — Encounter: Payer: Self-pay | Admitting: Family Medicine

## 2010-12-23 ENCOUNTER — Other Ambulatory Visit: Payer: Self-pay | Admitting: Family Medicine

## 2011-01-01 ENCOUNTER — Telehealth: Payer: Self-pay | Admitting: *Deleted

## 2011-01-01 ENCOUNTER — Telehealth: Payer: Self-pay | Admitting: Cardiovascular Disease

## 2011-01-01 NOTE — Telephone Encounter (Signed)
Ok to do

## 2011-01-01 NOTE — Telephone Encounter (Signed)
Notified patient samples available of plavix 75 mg for two week supply.

## 2011-01-01 NOTE — Telephone Encounter (Signed)
Would like samples of Plavix.

## 2011-01-01 NOTE — Telephone Encounter (Signed)
Pt requests samples of crestor.  Number 4 boxes of 7 each given.  Lot number [id number] , exp 07/2013.  Pt to pick up.

## 2011-01-24 ENCOUNTER — Telehealth: Payer: Self-pay | Admitting: *Deleted

## 2011-01-24 NOTE — Telephone Encounter (Signed)
Pt came in, requested samples of crestor 20 mg's, he doesn't have insurance.  #2 boxes of lot number [id number] , exp 11/14 and #2 boxes of lot number [id number], exp 11/14 given to the patient.

## 2011-02-18 ENCOUNTER — Ambulatory Visit: Payer: Self-pay | Admitting: Family Medicine

## 2011-02-18 ENCOUNTER — Telehealth: Payer: Self-pay | Admitting: *Deleted

## 2011-02-18 NOTE — Telephone Encounter (Signed)
Pt is asking for samples of crestor, please advise.

## 2011-02-19 NOTE — Telephone Encounter (Signed)
Please provide with 1 mo samples.  However pt needs appt for f/u.

## 2011-02-19 NOTE — Telephone Encounter (Signed)
Spoke with patient. Advised samples were ready for pick up. Also advised he needed to schedule follow up. Advised him that he no showed his appt yesterday and would need to be seen for further samples, etc. He was hesitant to schedule something on the phone with me due to starting a new job. He advised he would try to get something scheduled when he came to pick up his samples.

## 2011-04-07 ENCOUNTER — Telehealth: Payer: Self-pay | Admitting: *Deleted

## 2011-04-07 NOTE — Telephone Encounter (Signed)
Samples of crestor given, lot number [id number], exp 06/2013.  Pt to pick up.

## 2011-04-07 NOTE — Telephone Encounter (Signed)
Pt is asking for samples of crestor.  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  If ok, I have the samples of crestor 10 mg's, pt to take 2 a day, # 60 up front that I will give.

## 2011-04-07 NOTE — Telephone Encounter (Signed)
Ok thanks 

## 2011-04-10 ENCOUNTER — Other Ambulatory Visit: Payer: Self-pay | Admitting: Family Medicine

## 2011-04-10 MED ORDER — HYDROCHLOROTHIAZIDE 25 MG PO TABS: 25.0000 mg | ORAL_TABLET | Freq: Every day | ORAL | Status: DC

## 2011-05-09 ENCOUNTER — Ambulatory Visit (INDEPENDENT_AMBULATORY_CARE_PROVIDER_SITE_OTHER): Payer: Self-pay | Admitting: Family Medicine

## 2011-05-09 ENCOUNTER — Encounter: Payer: Self-pay | Admitting: Family Medicine

## 2011-05-09 ENCOUNTER — Telehealth: Payer: Self-pay | Admitting: *Deleted

## 2011-05-09 DIAGNOSIS — D696 Thrombocytopenia, unspecified: Secondary | ICD-10-CM

## 2011-05-09 DIAGNOSIS — E785 Hyperlipidemia, unspecified: Secondary | ICD-10-CM

## 2011-05-09 DIAGNOSIS — R7309 Other abnormal glucose: Secondary | ICD-10-CM

## 2011-05-09 DIAGNOSIS — I2510 Atherosclerotic heart disease of native coronary artery without angina pectoris: Secondary | ICD-10-CM

## 2011-05-09 DIAGNOSIS — I10 Essential (primary) hypertension: Secondary | ICD-10-CM

## 2011-05-09 LAB — LIPID PANEL
HDL: 55.5 mg/dL (ref >39.00)
LDL Cholesterol: 40 mg/dL (ref 0–99)
Total CHOL/HDL Ratio: 2
Triglycerides: 93 mg/dL (ref 0.0–149.0)
VLDL: 18.6 mg/dL (ref 0.0–40.0)

## 2011-05-09 LAB — COMPREHENSIVE METABOLIC PANEL
ALT: 43 U/L (ref 0–53)
AST: 30 U/L (ref 0–37)
Alkaline Phosphatase: 64 U/L (ref 39–117)
CO2: 31 mEq/L (ref 19–32)
Creatinine, Ser: 1 mg/dL (ref 0.4–1.5)
GFR: 80.57 mL/min (ref >60.00)
Total Bilirubin: 1.7 mg/dL — ABNORMAL HIGH (ref 0.3–1.2)

## 2011-05-09 LAB — CBC WITH DIFFERENTIAL/PLATELET
Basophils Absolute: 0 10*3/uL (ref 0.0–0.1)
HCT: 44.8 % (ref 39.0–52.0)
Hemoglobin: 15.6 g/dL (ref 13.0–17.0)
Lymphs Abs: 1.1 10*3/uL (ref 0.7–4.0)
MCV: 93.9 fl (ref 78.0–100.0)
Monocytes Relative: 9.1 % (ref 3.0–12.0)
Neutro Abs: 4.4 10*3/uL (ref 1.4–7.7)
RDW: 12.8 % (ref 11.5–14.6)

## 2011-05-09 MED ORDER — ROSUVASTATIN CALCIUM 20 MG PO TABS: 20.0000 mg | ORAL_TABLET | Freq: Every day | ORAL | Status: DC

## 2011-05-09 MED ORDER — HYDROCHLOROTHIAZIDE 25 MG PO TABS: 25.0000 mg | ORAL_TABLET | Freq: Every day | ORAL | Status: DC

## 2011-05-09 MED ORDER — METOPROLOL TARTRATE 25 MG PO TABS: 25.0000 mg | ORAL_TABLET | Freq: Two times a day (BID) | ORAL | Status: DC

## 2011-05-09 NOTE — Telephone Encounter (Signed)
Did not get message until after pt seen.

## 2011-05-09 NOTE — Progress Notes (Signed)
Crestor called into as directed [facility] on [address] due to e-scribe error.

## 2011-05-09 NOTE — Patient Instructions (Addendum)
Start multivitamin. Blood work today. Return at your convenience for physical. Good to see you today, call us with questions. Price out [employer] and ask about lipitor price.  Let me know which you prefer.

## 2011-05-09 NOTE — Progress Notes (Signed)
  Subjective:    Patient ID: Joseph Shea, male    DOB: 10/17/1950, 61 y.o.   MRN: [id number]  HPI CC: BP refill and f/u  Has been trucking.  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  Finds difficulty maintaining healthy diet/lifestyle with trucking schedule.  Weight down, attributes to poor food habits.  1. HTN - No HA, vision changes, CP/tightness, SOB, leg swelling.  Compliant with meds.  Ok control, no lows.    2. HLD - compliant with crestor.  Requests blood work today.  No myalgias.  3. CAD - no sxs.  Wt Readings from Last 3 Encounters:  05/09/11 179 lb 12 oz (81.534 kg)  09/20/10 186 lb 4 oz (84.482 kg)  08/20/10 187 lb 8 oz (85.049 kg)   Review of Systems Per HPI    Objective:   Physical Exam  Vitals reviewed. Constitutional: He appears well-developed and well-nourished. No distress.  HENT:  Head: Normocephalic and atraumatic.  Mouth/Throat: Oropharynx is clear and moist. No oropharyngeal exudate.  Eyes: Conjunctivae and EOM are normal. Pupils are equal, round, and reactive to light. No scleral icterus.  Neck: Normal range of motion. Neck supple. Carotid bruit is not present.  Cardiovascular: Normal rate, regular rhythm, normal heart sounds and intact distal pulses.   No murmur heard. Pulmonary/Chest: Effort normal and breath sounds normal. No respiratory distress. He has no wheezes. He has no rales.  Abdominal: Soft. Bowel sounds are normal.       No abd/renal bruits  Skin: Skin is warm and dry. No rash noted.          Assessment & Plan:

## 2011-05-09 NOTE — Telephone Encounter (Signed)
Patient has an appt today at 8:15 and wife wanted you to be aware that patient is having a problem with his feet and would like for you to take a look at them while he is here.

## 2011-05-10 ENCOUNTER — Encounter: Payer: Self-pay | Admitting: Family Medicine

## 2011-05-10 NOTE — Assessment & Plan Note (Signed)
Good control, continue meds. BP Readings from Last 3 Encounters:  05/09/11 136/80  09/20/10 122/70  08/20/10 130/84

## 2011-05-10 NOTE — Assessment & Plan Note (Signed)
recheck glu.

## 2011-05-10 NOTE — Assessment & Plan Note (Signed)
Check FLP today.  Compliant with crestor.  Finding still very expensive even with insurance. Will ask pt to price out atorvastatin when next goes to [facility], may change to 10mg  or 20mg .

## 2011-05-10 NOTE — Assessment & Plan Note (Signed)
Stable, asxs. 

## 2011-07-11 IMAGING — CR DG CHEST 1V PORT
1 series · 1 of 1 positions shown · non-contrast
Comparison: None.

CLINICAL DATA: Chest pain/short of breath

PORTABLE CHEST - 1 VIEW

[view not recorded]
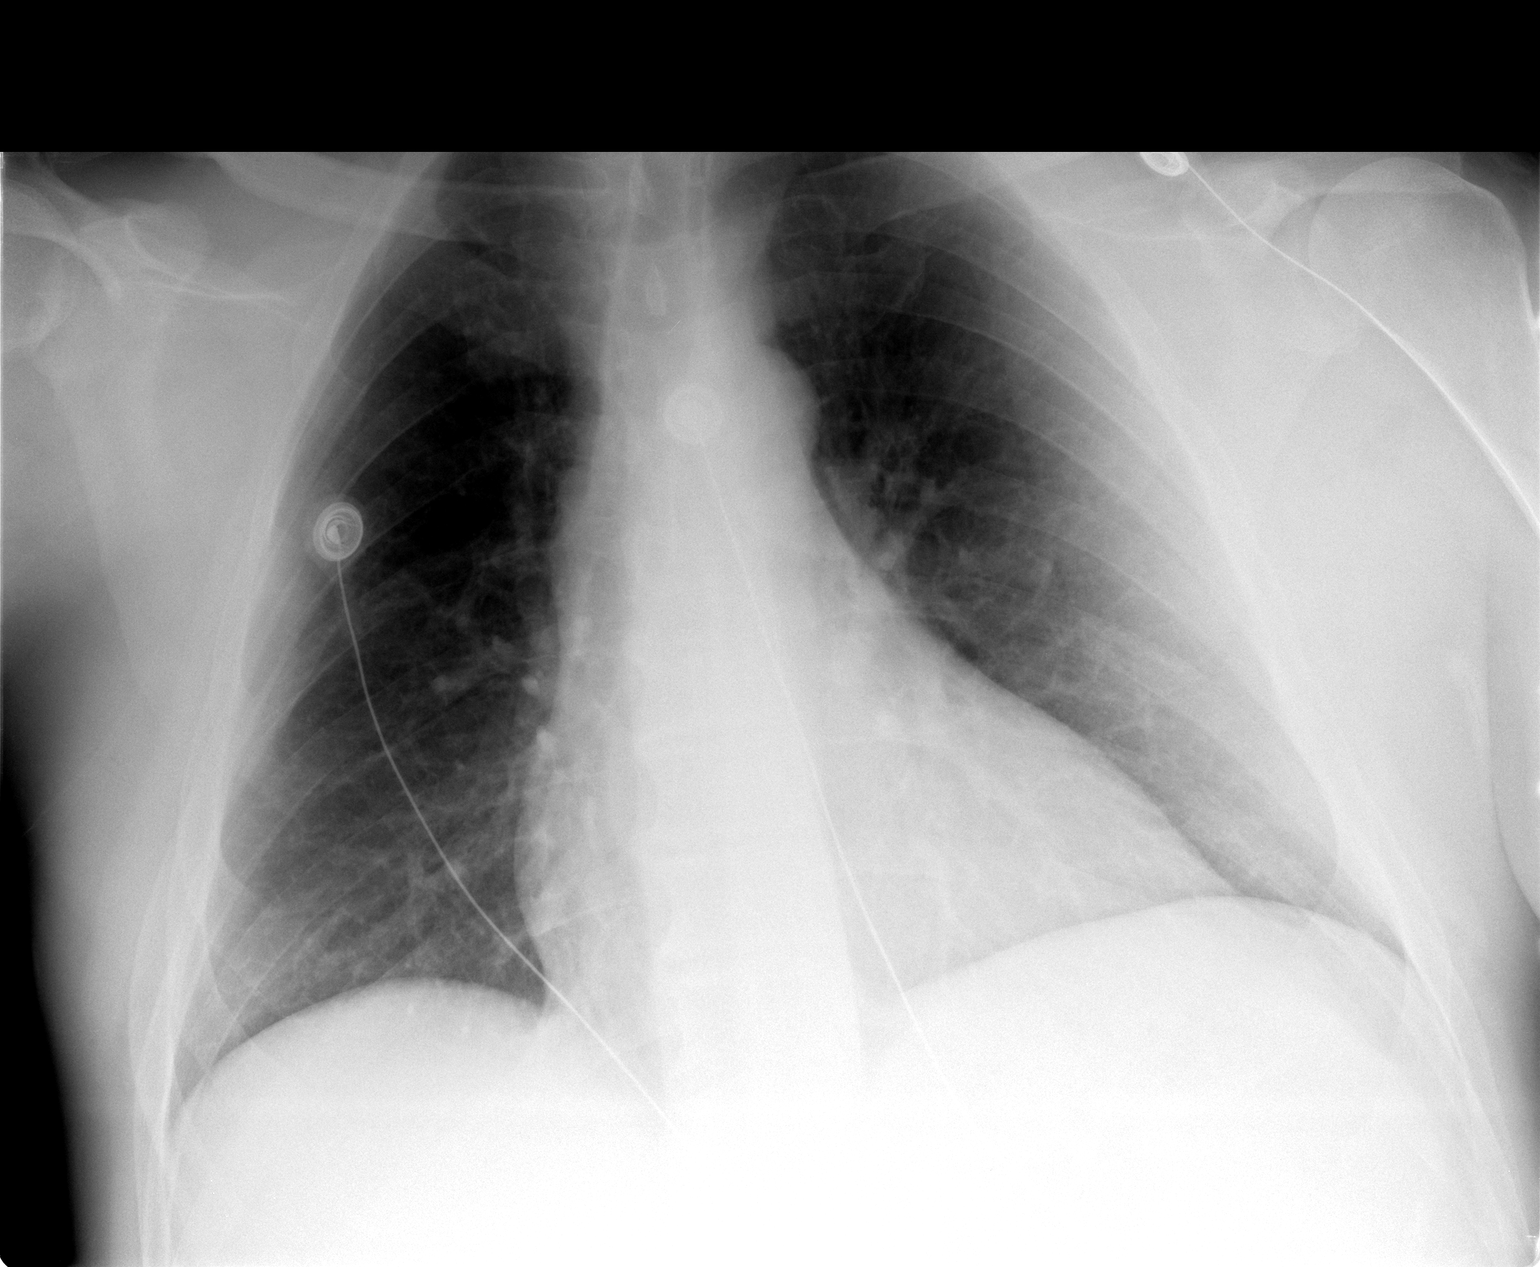

[1 of 1 positions shown; findings below may reference images not displayed]

FINDINGS: Heart size within normal limits considering AP
projection.  No heart failure.  Lungs clear in one-view.
IMPRESSION: No active disease in one-view.

## 2011-12-15 ENCOUNTER — Encounter: Payer: Self-pay | Admitting: Cardiovascular Disease

## 2011-12-15 ENCOUNTER — Ambulatory Visit (INDEPENDENT_AMBULATORY_CARE_PROVIDER_SITE_OTHER): Payer: BC Managed Care – PPO | Admitting: Cardiovascular Disease

## 2011-12-15 VITALS — BP 169/100 | HR 70 | Ht 69.0 in | Wt 181.0 lb

## 2011-12-15 DIAGNOSIS — E785 Hyperlipidemia, unspecified: Secondary | ICD-10-CM

## 2011-12-15 DIAGNOSIS — I10 Essential (primary) hypertension: Secondary | ICD-10-CM

## 2011-12-15 DIAGNOSIS — D696 Thrombocytopenia, unspecified: Secondary | ICD-10-CM

## 2011-12-15 DIAGNOSIS — I2510 Atherosclerotic heart disease of native coronary artery without angina pectoris: Secondary | ICD-10-CM

## 2011-12-15 MED ORDER — LISINOPRIL 20 MG PO TABS: 20.0000 mg | ORAL_TABLET | Freq: Every day | ORAL | Status: DC

## 2011-12-15 NOTE — Patient Instructions (Addendum)
You are doing well. Start lisinopril 20 mg once a day  We will schedule you for a treadmill test when you call  Please call us if you have new issues that need to be addressed before your next appt.  Your physician wants you to follow-up in: 12 months.  You will receive a reminder letter in the mail two months in advance. If you don't receive a letter, please call our office to schedule the follow-up appointment.

## 2011-12-15 NOTE — Assessment & Plan Note (Addendum)
Blood pressure is elevated today. 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 Some of his hypertension could be secondary to recent job stress this morning. We'll start lisinopril 20 mg daily and have suggested he take HCTZ only as needed.

## 2011-12-15 NOTE — Progress Notes (Signed)
Patient ID: Joseph Shea, male    DOB: Feb 20, 1951, 61 y.o.   MRN: [id number]  HPI Comments: 61 year old male with a history of coronary artery disease, 2 stents placed in 2004 with recent intervention in May 2011 for in-stent restenosis of his circumflex stent with DES stent placed who returns for followup.     overall he has been feeling well with no symptoms of chest pain or shortness of breath. He is active, does not participate in an exercise program though has no complaints. He currently drives a truck for a living.   he does report having problems with erectile dysfunction and wonders if this could be secondary to stress in his life. He has been having some marital relation ship issues. He does not like HCTZ when he drives his truck as he is unable to stop to go to the bathroom. He would like to change blood pressure medication. He has not been taking HCTZ recently, only metoprolol  He'll need a treadmill for DOT physical next month   Cardiac Cath 01/24/2010 1. Minor nonobstructive left anterior descending stenosis. 2. Severe in-stent restenosis to the left circumflex with successful     percutaneous coronary intervention using a drug-eluting stent. 3. Nonobstructive right coronary artery stenosis. 4. Normal left ventricular function.   EKG shows normal sinus rhythm with rate 70 beats per minute, nonspecific ST changes in the anterior precordial leads         Outpatient Encounter Prescriptions as of 12/15/2011  Medication Sig Dispense Refill  . aspirin 325 MG tablet Take 325 mg by mouth daily.        . hydrochlorothiazide 25 MG tablet Take 1 tablet (25 mg total) by mouth daily.  90 tablet  3  . metoprolol tartrate (LOPRESSOR) 25 MG tablet Take 1 tablet (25 mg total) by mouth 2 (two) times daily.  180 tablet  3  . Multiple Vitamin (MULTIVITAMIN PO) Take 1 tablet by mouth daily.        . nitroGLYCERIN (NITROSTAT) 0.4 MG SL tablet Place 0.4 mg under the tongue every 5 (five)  minutes as needed.        . rosuvastatin (CRESTOR) 20 MG tablet Take 1 tablet (20 mg total) by mouth daily.  90 tablet  3  . tadalafil (CIALIS) 5 MG tablet Take 5 mg by mouth as directed.           Review of Systems  Constitutional: Negative.   HENT: Negative.   Eyes: Negative.   Respiratory: Negative.   Cardiovascular: Negative.   Gastrointestinal: Negative.   Musculoskeletal: Negative.   Skin: Negative.   Neurological: Negative.   Hematological: Negative.   Psychiatric/Behavioral: Negative.   All other systems reviewed and are negative.    BP 169/100  Pulse 70  Ht 5\' 9"  (1.753 m)  Wt 181 lb (82.101 kg)  BMI 26.73 kg/m2 He reports blood pressure is elevated today because of recent stress on the job this morning Physical Exam  Nursing note and vitals reviewed. Constitutional: He is oriented to person, place, and time. He appears well-developed and well-nourished.  HENT:  Head: Normocephalic.  Nose: Nose normal.  Mouth/Throat: Oropharynx is clear and moist.  Eyes: Conjunctivae are normal. Pupils are equal, round, and reactive to light.  Neck: Normal range of motion. Neck supple. No JVD present.  Cardiovascular: Normal rate, regular rhythm, S1 normal, S2 normal, normal heart sounds and intact distal pulses.  Exam reveals no gallop and no friction rub.   No murmur  heard. Pulmonary/Chest: Effort normal and breath sounds normal. No respiratory distress. He has no wheezes. He has no rales. He exhibits no tenderness.  Abdominal: Soft. Bowel sounds are normal. He exhibits no distension. There is no tenderness.  Musculoskeletal: Normal range of motion. He exhibits no edema and no tenderness.  Lymphadenopathy:    He has no cervical adenopathy.  Neurological: He is alert and oriented to person, place, and time. Coordination normal.  Skin: Skin is warm and dry. No rash noted. No erythema.  Psychiatric: He has a normal mood and affect. His behavior is normal. Judgment and thought  content normal.           Assessment and Plan

## 2011-12-15 NOTE — Assessment & Plan Note (Signed)
Continue Crestor. Goal LDL less than 70 

## 2011-12-15 NOTE — Assessment & Plan Note (Signed)
Currently with no symptoms of angina.  Continue current medication regimen. Will order treadmill study at his convenience for DOT physical next month.

## 2011-12-29 ENCOUNTER — Telehealth: Payer: Self-pay

## 2011-12-29 NOTE — Telephone Encounter (Signed)
Was told to call the office when ready to have the treadmill test. GXT scheduled for May 6

## 2012-01-26 ENCOUNTER — Encounter: Payer: Self-pay | Admitting: Cardiovascular Disease

## 2012-01-26 ENCOUNTER — Encounter: Payer: BC Managed Care – PPO | Admitting: Family Medicine

## 2012-01-26 ENCOUNTER — Ambulatory Visit (INDEPENDENT_AMBULATORY_CARE_PROVIDER_SITE_OTHER): Payer: BC Managed Care – PPO | Admitting: Cardiovascular Disease

## 2012-01-26 DIAGNOSIS — I2510 Atherosclerotic heart disease of native coronary artery without angina pectoris: Secondary | ICD-10-CM

## 2012-01-26 DIAGNOSIS — I10 Essential (primary) hypertension: Secondary | ICD-10-CM

## 2012-01-26 NOTE — Procedures (Signed)
Exercise Treadmill Test  Treadmill ordered for DOT physical  Resting EKG shows NSR with rate of 89 bpm, no significant ST or T wave changes Resting blood pressure of 128/82. Stand bruce protocal was used.  Patient exercised for 10 min 00 sec,  Peak heart rate of 153 bpm.  This was 105% of the maximum predicted heart rate (target heart rate 150). Achieved 12.9 METS No symptoms of chest pain or lightheadedness were reported at peak stress or in recovery.  Peak Blood pressure recorded was 168/86 Heart rate at 3 minutes in recovery was 100 bpm. No significant ST abn on EKG concerning for ischemia.(-0.9 mm upsloping at peak stress)  FINAL IMPRESSION: Normal exercise stress test. No significant EKG changes concerning for ischemia. Excellent exercise tolerance.

## 2012-01-26 NOTE — Patient Instructions (Signed)
Normal exercise study. No further testing needed.

## 2012-05-28 ENCOUNTER — Other Ambulatory Visit: Payer: Self-pay | Admitting: Family Medicine

## 2012-06-20 ENCOUNTER — Other Ambulatory Visit: Payer: Self-pay | Admitting: Family Medicine

## 2012-06-20 DIAGNOSIS — I10 Essential (primary) hypertension: Secondary | ICD-10-CM

## 2012-06-20 DIAGNOSIS — R7309 Other abnormal glucose: Secondary | ICD-10-CM

## 2012-06-20 DIAGNOSIS — E785 Hyperlipidemia, unspecified: Secondary | ICD-10-CM

## 2012-06-21 ENCOUNTER — Other Ambulatory Visit (INDEPENDENT_AMBULATORY_CARE_PROVIDER_SITE_OTHER): Payer: BC Managed Care – PPO

## 2012-06-21 DIAGNOSIS — E785 Hyperlipidemia, unspecified: Secondary | ICD-10-CM

## 2012-06-21 DIAGNOSIS — R7309 Other abnormal glucose: Secondary | ICD-10-CM

## 2012-06-21 LAB — COMPREHENSIVE METABOLIC PANEL
AST: 22 U/L (ref 0–37)
Albumin: 4.3 g/dL (ref 3.5–5.2)
Alkaline Phosphatase: 81 U/L (ref 39–117)
Glucose, Bld: 110 mg/dL — ABNORMAL HIGH (ref 70–99)
Potassium: 4.4 mEq/L (ref 3.5–5.1)
Sodium: 137 mEq/L (ref 135–145)
Total Protein: 6.7 g/dL (ref 6.0–8.3)

## 2012-06-21 LAB — LIPID PANEL
LDL Cholesterol: 70 mg/dL (ref 0–99)
VLDL: 19.4 mg/dL (ref 0.0–40.0)

## 2012-06-22 LAB — CBC WITH DIFFERENTIAL/PLATELET
Basophils Absolute: 0 10*3/uL (ref 0.0–0.1)
Basophils Relative: 0.4 % (ref 0.0–3.0)
Eosinophils Absolute: 0.1 10*3/uL (ref 0.0–0.7)
Eosinophils Relative: 1.2 % (ref 0.0–5.0)
HCT: 47.4 % (ref 39.0–52.0)
Hemoglobin: 15.9 g/dL (ref 13.0–17.0)
Lymphocytes Relative: 21 % (ref 12.0–46.0)
Lymphs Abs: 1.2 10*3/uL (ref 0.7–4.0)
MCHC: 33.6 g/dL (ref 30.0–36.0)
MCV: 95.8 fl (ref 78.0–100.0)
Monocytes Absolute: 0.5 10*3/uL (ref 0.1–1.0)
Monocytes Relative: 8.3 % (ref 3.0–12.0)
Neutro Abs: 4.1 10*3/uL (ref 1.4–7.7)
Neutrophils Relative %: 69.1 % (ref 43.0–77.0)
Platelets: 148 10*3/uL — ABNORMAL LOW (ref 150.0–400.0)
RBC: 4.95 Mil/uL (ref 4.22–5.81)
RDW: 13.2 % (ref 11.5–14.6)
WBC: 5.9 10*3/uL (ref 4.5–10.5)

## 2012-06-22 LAB — TSH: TSH: 2 u[IU]/mL (ref 0.35–5.50)

## 2012-06-28 ENCOUNTER — Ambulatory Visit (INDEPENDENT_AMBULATORY_CARE_PROVIDER_SITE_OTHER): Payer: BC Managed Care – PPO | Admitting: Family Medicine

## 2012-06-28 ENCOUNTER — Encounter: Payer: Self-pay | Admitting: Family Medicine

## 2012-06-28 VITALS — BP 150/90 | HR 72 | Temp 97.5°F | Ht 69.5 in | Wt 184.8 lb

## 2012-06-28 DIAGNOSIS — Z639 Problem related to primary support group, unspecified: Secondary | ICD-10-CM

## 2012-06-28 DIAGNOSIS — Z0000 Encounter for general adult medical examination without abnormal findings: Secondary | ICD-10-CM | POA: Insufficient documentation

## 2012-06-28 DIAGNOSIS — E785 Hyperlipidemia, unspecified: Secondary | ICD-10-CM

## 2012-06-28 DIAGNOSIS — I10 Essential (primary) hypertension: Secondary | ICD-10-CM

## 2012-06-28 DIAGNOSIS — Z6379 Other stressful life events affecting family and household: Secondary | ICD-10-CM | POA: Insufficient documentation

## 2012-06-28 DIAGNOSIS — R7309 Other abnormal glucose: Secondary | ICD-10-CM

## 2012-06-28 DIAGNOSIS — Z1211 Encounter for screening for malignant neoplasm of colon: Secondary | ICD-10-CM

## 2012-06-28 DIAGNOSIS — I2510 Atherosclerotic heart disease of native coronary artery without angina pectoris: Secondary | ICD-10-CM

## 2012-06-28 DIAGNOSIS — D696 Thrombocytopenia, unspecified: Secondary | ICD-10-CM

## 2012-06-28 NOTE — Patient Instructions (Addendum)
Good to see you today, call us with questions. Look into families of alcoholics support group ([employer]). Stool kit today. Hang in there. I think groin strain should heal well on it's own.  Let me know if that's not the case. Keep an eye on blood pressure and let me know if consistently staying <140/90.  Buy bp cuff at home.

## 2012-06-28 NOTE — Assessment & Plan Note (Signed)
Preventative protocols reviewed and updated unless pt declined. Discussed healthy diet and lifestyle. Declines flu. UTD tetanus. Stool kit today. DRE reassuring today.  Will need PSA next blood work as pt desires continued screening

## 2012-06-28 NOTE — Assessment & Plan Note (Addendum)
Actually A1c great at 4.8%.  Changed dx today. Lab Results  Component Value Date   HGBA1C 4.8 06/21/2012

## 2012-06-28 NOTE — Assessment & Plan Note (Signed)
Discussed stressors at home.  Encouraged him to seek support through [employer].  Considering leaving wife.
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
## 2012-06-28 NOTE — Assessment & Plan Note (Signed)
Stable. Continue to monitor.  Recent treadmill great - excellent exercise tolerance.  No evidence of ischemia.

## 2012-06-28 NOTE — Assessment & Plan Note (Signed)
Very stable.  Minimally low.

## 2012-06-28 NOTE — Assessment & Plan Note (Signed)
BP Readings from Last 3 Encounters:  06/28/12 150/90  01/26/12 128/82  12/15/11 169/100  endorses great control at home - continue current regimen.  Asked him to monitor at home and notify me if bp running high.

## 2012-06-28 NOTE — Assessment & Plan Note (Signed)
crestor with great effect - continue. Goal LDL <70

## 2012-07-27 ENCOUNTER — Other Ambulatory Visit: Payer: BC Managed Care – PPO

## 2012-07-27 DIAGNOSIS — Z1211 Encounter for screening for malignant neoplasm of colon: Secondary | ICD-10-CM

## 2012-07-28 ENCOUNTER — Encounter: Payer: Self-pay | Admitting: *Deleted

## 2012-09-17 ENCOUNTER — Other Ambulatory Visit: Payer: Self-pay | Admitting: Family Medicine

## 2012-09-20 ENCOUNTER — Telehealth: Payer: Self-pay | Admitting: Family Medicine

## 2012-09-20 MED ORDER — ROSUVASTATIN CALCIUM 20 MG PO TABS: 20.0000 mg | ORAL_TABLET | Freq: Every day | ORAL | Status: DC

## 2012-09-20 NOTE — Telephone Encounter (Signed)
Pt left vm stating that he has been trying to get his Crestor 20mg  filled but the [facility] on [address] keeps telling him they are unable to fill it.  RX was sent in on 09/17/12.  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

## 2012-09-20 NOTE — Telephone Encounter (Signed)
Spoke with patient and sent in med to [facility] in [place] as requested.

## 2012-11-27 ENCOUNTER — Other Ambulatory Visit: Payer: Self-pay | Admitting: Family Medicine

## 2013-01-17 ENCOUNTER — Encounter: Payer: Self-pay | Admitting: Cardiovascular Disease

## 2013-01-17 ENCOUNTER — Ambulatory Visit (INDEPENDENT_AMBULATORY_CARE_PROVIDER_SITE_OTHER): Payer: BC Managed Care – PPO | Admitting: Cardiovascular Disease

## 2013-01-17 VITALS — BP 138/90 | HR 67 | Ht 69.0 in | Wt 186.5 lb

## 2013-01-17 DIAGNOSIS — E785 Hyperlipidemia, unspecified: Secondary | ICD-10-CM

## 2013-01-17 DIAGNOSIS — I10 Essential (primary) hypertension: Secondary | ICD-10-CM

## 2013-01-17 DIAGNOSIS — I2510 Atherosclerotic heart disease of native coronary artery without angina pectoris: Secondary | ICD-10-CM

## 2013-01-17 NOTE — Assessment & Plan Note (Signed)
Blood pressure is well controlled on today's visit. No changes made to the medications. 

## 2013-01-17 NOTE — Patient Instructions (Addendum)
You are doing well.  Please continue aspirin 81 mg x 2  Please call us if you have new issues that need to be addressed before your next appt.  Your physician wants you to follow-up in: 12 months.  You will receive a reminder letter in the mail two months in advance. If you don't receive a letter, please call our office to schedule the follow-up appointment.

## 2013-01-17 NOTE — Assessment & Plan Note (Signed)
Cholesterol is at goal on the current lipid regimen. No changes to the medications were made.  

## 2013-01-17 NOTE — Progress Notes (Signed)
Patient ID: Joseph Shea, male    DOB: [birth year], 62 y.o.   MRN: [id number]  HPI Comments: 62 year old male with a history of coronary artery disease, 2 stents placed in 2004 with recent intervention in May 2011 for in-stent restenosis of his circumflex stent with DES stent placed who returns for followup.     overall he has been feeling well with no symptoms of chest pain or shortness of breath.  He is active, does not participate in an exercise program though has no complaints. 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   Previously reported having problems with erectile dysfunction. Stopped HCTZ previously. unable to stop to go to the bathroom.  Blood pressure recently has been well controlled.   DOT physical required soon   Cardiac Cath 01/24/2010 1. Minor nonobstructive left anterior descending stenosis. 2. Severe in-stent restenosis to the left circumflex with successful     percutaneous coronary intervention using a drug-eluting stent. 3. Nonobstructive right coronary artery stenosis. 4. Normal left ventricular function.   EKG shows normal sinus rhythm with rate 67 beats per minute, nonspecific ST changes in the anterior precordial leads         Outpatient Encounter Prescriptions as of 01/17/2013  Medication Sig Dispense Refill  . aspirin 325 MG tablet Take 325 mg by mouth daily.        . metoprolol tartrate (LOPRESSOR) 25 MG tablet TAKE ONE TABLET BY MOUTH TWICE DAILY  180 tablet  1  . Multiple Vitamin (MULTIVITAMIN PO) Take 1 tablet by mouth daily.        . nitroGLYCERIN (NITROSTAT) 0.4 MG SL tablet Place 0.4 mg under the tongue every 5 (five) minutes as needed.        . rosuvastatin (CRESTOR) 20 MG tablet Take 1 tablet (20 mg total) by mouth daily.  30 tablet  3   No facility-administered encounter medications on file as of 01/17/2013.     Review of Systems  Constitutional: Negative.   HENT: Negative.    Eyes: Negative.   Respiratory: Negative.   Cardiovascular: Negative.   Gastrointestinal: Negative.   Musculoskeletal: Negative.   Skin: Negative.   Neurological: Negative.   Psychiatric/Behavioral: Negative.   All other systems reviewed and are negative.   BP 138/90  Pulse 67  Ht 5\' 9"  (1.753 m)  Wt 186 lb 8 oz (84.596 kg)  BMI 27.53 kg/m2  Physical Exam  Nursing note and vitals reviewed. Constitutional: He is oriented to person, place, and time. He appears well-developed and well-nourished.  HENT:  Head: Normocephalic.  Nose: Nose normal.  Mouth/Throat: Oropharynx is clear and moist.  Eyes: Conjunctivae are normal. Pupils are equal, round, and reactive to light.  Neck: Normal range of motion. Neck supple. No JVD present.  Cardiovascular: Normal rate, regular rhythm, S1 normal, S2 normal, normal heart sounds and intact distal pulses.  Exam reveals no gallop and no friction rub.   No murmur heard. Pulmonary/Chest: Effort normal and breath sounds normal. No respiratory distress. He has no wheezes. He has no rales. He exhibits no tenderness.  Abdominal: Soft. Bowel sounds are normal. He exhibits no distension. There is no tenderness.  Musculoskeletal: Normal range of motion. He exhibits no edema and no tenderness.  Lymphadenopathy:    He has no cervical adenopathy.  Neurological: He is alert and oriented to person, place, and time. Coordination normal.  Skin: Skin is warm and dry. No rash noted. No erythema.  Psychiatric: He has a normal mood and affect. His behavior is normal. Judgment and thought content normal.      Assessment and Plan

## 2013-01-17 NOTE — Assessment & Plan Note (Signed)
Currently with no symptoms of angina. No further workup at this time. Continue current medication regimen. 

## 2013-02-12 ENCOUNTER — Other Ambulatory Visit: Payer: Self-pay | Admitting: Cardiovascular Disease

## 2013-07-08 ENCOUNTER — Telehealth: Payer: Self-pay | Admitting: *Deleted

## 2013-07-08 NOTE — Telephone Encounter (Signed)
Samples of Crestor 20mg . 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

## 2013-07-08 NOTE — Telephone Encounter (Signed)
Spoke w/ pt.  He is a [employer] and needs a stress test scheduled for April 2015 for DOT. Pt understands that our schedule is not available for April, but he is on the recall list for a stress test to be scheduled at that time.  Informed him that if he needs anything in writing for his job, please contact us and we will accommodate him. Left samples of Crestor 20mg  at front desk for pt to pick up today.

## 2013-07-08 NOTE — Telephone Encounter (Signed)
Please call patient has some question regarding his stress test. Thanks

## 2013-07-08 NOTE — Telephone Encounter (Signed)
See Below:

## 2013-09-29 ENCOUNTER — Other Ambulatory Visit: Payer: Self-pay | Admitting: Family Medicine

## 2013-10-02 ENCOUNTER — Other Ambulatory Visit: Payer: Self-pay | Admitting: Family Medicine

## 2013-10-31 ENCOUNTER — Telehealth: Payer: Self-pay

## 2013-10-31 NOTE — Telephone Encounter (Signed)
Pt would like a couple weeks worth of Crestor Samples

## 2013-10-31 NOTE — Telephone Encounter (Signed)
Placed crestor 20 mg to pick up.

## 2013-11-11 ENCOUNTER — Encounter: Payer: Self-pay | Admitting: Cardiovascular Disease

## 2013-11-11 ENCOUNTER — Ambulatory Visit (INDEPENDENT_AMBULATORY_CARE_PROVIDER_SITE_OTHER): Payer: BC Managed Care – PPO | Admitting: Cardiovascular Disease

## 2013-11-11 VITALS — BP 154/82 | HR 70 | Ht 69.5 in | Wt 187.8 lb

## 2013-11-11 VITALS — BP 152/108 | HR 60 | Ht 69.5 in | Wt 187.8 lb

## 2013-11-11 DIAGNOSIS — I2510 Atherosclerotic heart disease of native coronary artery without angina pectoris: Secondary | ICD-10-CM

## 2013-11-11 DIAGNOSIS — Z0000 Encounter for general adult medical examination without abnormal findings: Secondary | ICD-10-CM

## 2013-11-11 DIAGNOSIS — I10 Essential (primary) hypertension: Secondary | ICD-10-CM

## 2013-11-11 DIAGNOSIS — E785 Hyperlipidemia, unspecified: Secondary | ICD-10-CM

## 2013-11-11 NOTE — Procedures (Signed)
Exercise Treadmill Test  Treadmill ordered for recent epsiodes of chest pain.  Resting EKG shows NSR with rate of 68 bpm, no significant ST or T wave changes Resting blood pressure of 152/98 Stand bruce protocal was used.  Patient exercised for 9 min 00 sec,  Peak heart rate of 141 bpm.  This was 88% of the maximum predicted heart rate (target heart rate 136). Achieved 10.1 METS No symptoms of chest pain or lightheadedness were reported at peak stress or in recovery.  Peak Blood pressure recorded was 188/90 Heart rate at 3 minutes in recovery was 87 bpm. No ST changes concerning for ischemia at peak stress or in recovery  FINAL IMPRESSION: Normal exercise stress test. No significant EKG changes concerning for ischemia. Excellent exercise tolerance.

## 2013-11-11 NOTE — Assessment & Plan Note (Signed)
Cholesterol is at goal on the current lipid regimen. No changes to the medications were made.  

## 2013-11-11 NOTE — Assessment & Plan Note (Signed)
Currently with no symptoms of angina. No further workup at this time. Continue current medication regimen. DOT physical required. We will perform a stress treadmill today

## 2013-11-11 NOTE — Assessment & Plan Note (Signed)
He is anxious about his DOT physical. Suggested he monitor his blood pressure closely at home

## 2013-11-11 NOTE — Progress Notes (Signed)
   Patient ID: Joseph Shea, male    DOB: 01/19/1951, 63 y.o.   MRN: [id number]  HPI Comments: 63 year old male with a history of coronary artery disease, 2 stents placed in 2004 with recent intervention in May 2011 for in-stent restenosis of his circumflex stent with DES stent placed who returns for followup.     overall he has been feeling well with no symptoms of chest pain or shortness of breath.  He is active, does not participate in an exercise program though has no complaints.  Recently changed jobs. Needs a DOT evaluation Blood pressure recently has been well controlled.  Prior stress test, treadmill 2 years ago with no significant EKG changes concerning for ischemia   Cardiac Cath 01/24/2010 1. Minor nonobstructive left anterior descending stenosis. 2. Severe in-stent restenosis to the left circumflex with successful     percutaneous coronary intervention using a drug-eluting stent. 3. Nonobstructive right coronary artery stenosis. 4. Normal left ventricular function.   EKG shows normal sinus rhythm with rate 70 beats per minute, no significant ST or T wave changes         Outpatient Encounter Prescriptions as of 11/11/2013  Medication Sig  . aspirin 81 MG tablet Take 81 mg by mouth 2 (two) times daily.  . CRESTOR 20 MG tablet TAKE ONE TABLET EVERY DAY **MUST HAVE APPOINTMENT FOR FURTHER REFILLS**  . lisinopril (PRINIVIL,ZESTRIL) 20 MG tablet Take 20 mg by mouth daily.  . metoprolol tartrate (LOPRESSOR) 25 MG tablet   . nitroGLYCERIN (NITROSTAT) 0.4 MG SL tablet Place 0.4 mg under the tongue every 5 (five) minutes as needed.    . Multiple Vitamin (MULTIVITAMIN PO) Take 1 tablet by mouth daily.     Review of Systems  Constitutional: Negative.   HENT: Negative.   Eyes: Negative.   Respiratory: Negative.   Cardiovascular: Negative.   Gastrointestinal: Negative.   Endocrine: Negative.   Musculoskeletal: Negative.   Skin: Negative.   Allergic/Immunologic: Negative.    Neurological: Negative.   Hematological: Negative.   Psychiatric/Behavioral: Negative.   All other systems reviewed and are negative.   BP 154/82  Pulse 70  Ht 5' 9.5" (1.765 m)  Wt 187 lb 12.8 oz (85.186 kg)  BMI 27.35 kg/m2  Physical Exam  Nursing note and vitals reviewed. Constitutional: He is oriented to person, place, and time. He appears well-developed and well-nourished.  HENT:  Head: Normocephalic.  Nose: Nose normal.  Mouth/Throat: Oropharynx is clear and moist.  Eyes: Conjunctivae are normal. Pupils are equal, round, and reactive to light.  Neck: Normal range of motion. Neck supple. No JVD present.  Cardiovascular: Normal rate, regular rhythm, S1 normal, S2 normal, normal heart sounds and intact distal pulses.  Exam reveals no gallop and no friction rub.   No murmur heard. Pulmonary/Chest: Effort normal and breath sounds normal. No respiratory distress. He has no wheezes. He has no rales. He exhibits no tenderness.  Abdominal: Soft. Bowel sounds are normal. He exhibits no distension. There is no tenderness.  Musculoskeletal: Normal range of motion. He exhibits no edema and no tenderness.  Lymphadenopathy:    He has no cervical adenopathy.  Neurological: He is alert and oriented to person, place, and time. Coordination normal.  Skin: Skin is warm and dry. No rash noted. No erythema.  Psychiatric: He has a normal mood and affect. His behavior is normal. Judgment and thought content normal.      Assessment and Plan

## 2013-11-11 NOTE — Patient Instructions (Addendum)
You are doing well. No medication changes were made.  We will do a treadmill stress test today  Please call us if you have new issues that need to be addressed before your next appt.  Your physician wants you to follow-up in: 12 months.  You will receive a reminder letter in the mail two months in advance. If you don't receive a letter, please call our office to schedule the follow-up appointment.

## 2013-11-11 NOTE — Patient Instructions (Signed)
Stress test was normal.

## 2014-01-02 ENCOUNTER — Ambulatory Visit: Payer: BC Managed Care – PPO | Admitting: Cardiovascular Disease

## 2014-01-31 ENCOUNTER — Telehealth: Payer: Self-pay

## 2014-01-31 NOTE — Telephone Encounter (Signed)
plz check to see if we have and provide. Also notify pt we will no longer carry samples

## 2014-01-31 NOTE — Telephone Encounter (Signed)
Pt would like Crestor samples. Please call

## 2014-01-31 NOTE — Telephone Encounter (Signed)
Placed samples of crestor at front desk for pick up.

## 2014-01-31 NOTE — Telephone Encounter (Signed)
Pt left v/m; pt does not have insurance now and request Crestor samples; pt request cb.

## 2014-01-31 NOTE — Telephone Encounter (Signed)
No samples available. Patient notified 

## 2014-02-20 ENCOUNTER — Other Ambulatory Visit: Payer: Self-pay | Admitting: Family Medicine

## 2014-02-20 ENCOUNTER — Other Ambulatory Visit: Payer: Self-pay | Admitting: Cardiovascular Disease

## 2014-02-21 MED ORDER — METOPROLOL TARTRATE 25 MG PO TABS: ORAL_TABLET | ORAL | Status: DC

## 2014-02-21 NOTE — Telephone Encounter (Signed)
Last seen 06/2012. Due for office visit, refilled 3 mo worth.

## 2014-03-21 NOTE — Telephone Encounter (Signed)
This encounter was created in error - please disregard.

## 2014-06-22 ENCOUNTER — Other Ambulatory Visit: Payer: Self-pay | Admitting: Family Medicine

## 2014-06-23 ENCOUNTER — Other Ambulatory Visit: Payer: Self-pay | Admitting: Family Medicine

## 2014-06-30 ENCOUNTER — Other Ambulatory Visit: Payer: Self-pay

## 2014-06-30 NOTE — Telephone Encounter (Signed)
Pt left v/m requesting refill crestor to [facility]. Pt last seen 06/28/2012 but pt has appt scheduled 07/24/14.Please advise.pt request cb.

## 2014-07-01 MED ORDER — ROSUVASTATIN CALCIUM 20 MG PO TABS: 20.0000 mg | ORAL_TABLET | Freq: Every day | ORAL | Status: DC

## 2014-07-01 NOTE — Telephone Encounter (Signed)
Sent in.plz notify pt.  

## 2014-07-03 NOTE — Telephone Encounter (Signed)
Message left notifying patient.

## 2014-07-17 ENCOUNTER — Other Ambulatory Visit (INDEPENDENT_AMBULATORY_CARE_PROVIDER_SITE_OTHER): Payer: Managed Care, Other (non HMO)

## 2014-07-17 ENCOUNTER — Other Ambulatory Visit: Payer: Self-pay | Admitting: Family Medicine

## 2014-07-17 DIAGNOSIS — R739 Hyperglycemia, unspecified: Secondary | ICD-10-CM

## 2014-07-17 DIAGNOSIS — E785 Hyperlipidemia, unspecified: Secondary | ICD-10-CM

## 2014-07-17 DIAGNOSIS — Z125 Encounter for screening for malignant neoplasm of prostate: Secondary | ICD-10-CM

## 2014-07-17 DIAGNOSIS — I10 Essential (primary) hypertension: Secondary | ICD-10-CM

## 2014-07-17 DIAGNOSIS — Z0000 Encounter for general adult medical examination without abnormal findings: Secondary | ICD-10-CM

## 2014-07-17 DIAGNOSIS — D696 Thrombocytopenia, unspecified: Secondary | ICD-10-CM

## 2014-07-17 LAB — CBC WITH DIFFERENTIAL/PLATELET
BASOS ABS: 0 10*3/uL (ref 0.0–0.1)
Basophils Relative: 0.5 % (ref 0.0–3.0)
EOS ABS: 0.1 10*3/uL (ref 0.0–0.7)
Eosinophils Relative: 1.3 % (ref 0.0–5.0)
HEMATOCRIT: 47.9 % (ref 39.0–52.0)
HEMOGLOBIN: 16.5 g/dL (ref 13.0–17.0)
LYMPHS ABS: 1.1 10*3/uL (ref 0.7–4.0)
Lymphocytes Relative: 20.1 % (ref 12.0–46.0)
MCHC: 34.3 g/dL (ref 30.0–36.0)
MCV: 94 fl (ref 78.0–100.0)
Monocytes Absolute: 0.5 10*3/uL (ref 0.1–1.0)
Monocytes Relative: 8.9 % (ref 3.0–12.0)
NEUTROS ABS: 3.8 10*3/uL (ref 1.4–7.7)
Neutrophils Relative %: 69.2 % (ref 43.0–77.0)
Platelets: 132 10*3/uL — ABNORMAL LOW (ref 150.0–400.0)
RBC: 5.1 Mil/uL (ref 4.22–5.81)
RDW: 13.6 % (ref 11.5–15.5)
WBC: 5.4 10*3/uL (ref 4.0–10.5)

## 2014-07-17 LAB — COMPREHENSIVE METABOLIC PANEL
ALK PHOS: 88 U/L (ref 39–117)
ALT: 38 U/L (ref 0–53)
AST: 22 U/L (ref 0–37)
Albumin: 3.9 g/dL (ref 3.5–5.2)
BILIRUBIN TOTAL: 1.1 mg/dL (ref 0.2–1.2)
BUN: 15 mg/dL (ref 6–23)
CALCIUM: 9.4 mg/dL (ref 8.4–10.5)
CHLORIDE: 105 meq/L (ref 96–112)
CO2: 30 mEq/L (ref 19–32)
CREATININE: 1 mg/dL (ref 0.4–1.5)
GFR: 85.54 mL/min (ref >60.00)
Glucose, Bld: 114 mg/dL — ABNORMAL HIGH (ref 70–99)
Potassium: 4.9 mEq/L (ref 3.5–5.1)
Sodium: 139 mEq/L (ref 135–145)
Total Protein: 6.7 g/dL (ref 6.0–8.3)

## 2014-07-17 LAB — LIPID PANEL
CHOL/HDL RATIO: 3
Cholesterol: 143 mg/dL (ref 0–200)
HDL: 56.6 mg/dL (ref >39.00)
LDL Cholesterol: 70 mg/dL (ref 0–99)
NONHDL: 86.4
Triglycerides: 80 mg/dL (ref 0.0–149.0)
VLDL: 16 mg/dL (ref 0.0–40.0)

## 2014-07-17 LAB — PSA: PSA: 0.51 ng/mL (ref 0.10–4.00)

## 2014-07-18 ENCOUNTER — Telehealth: Payer: Self-pay | Admitting: Family Medicine

## 2014-07-18 NOTE — Telephone Encounter (Signed)
emmi emailed °

## 2014-07-24 ENCOUNTER — Ambulatory Visit (INDEPENDENT_AMBULATORY_CARE_PROVIDER_SITE_OTHER): Payer: Managed Care, Other (non HMO) | Admitting: Family Medicine

## 2014-07-24 ENCOUNTER — Encounter: Payer: Self-pay | Admitting: Family Medicine

## 2014-07-24 VITALS — BP 140/88 | HR 76 | Temp 98.0°F | Ht 69.5 in | Wt 187.0 lb

## 2014-07-24 DIAGNOSIS — N400 Enlarged prostate without lower urinary tract symptoms: Secondary | ICD-10-CM | POA: Insufficient documentation

## 2014-07-24 DIAGNOSIS — I2510 Atherosclerotic heart disease of native coronary artery without angina pectoris: Secondary | ICD-10-CM

## 2014-07-24 DIAGNOSIS — R739 Hyperglycemia, unspecified: Secondary | ICD-10-CM

## 2014-07-24 DIAGNOSIS — D696 Thrombocytopenia, unspecified: Secondary | ICD-10-CM

## 2014-07-24 DIAGNOSIS — E785 Hyperlipidemia, unspecified: Secondary | ICD-10-CM

## 2014-07-24 DIAGNOSIS — I10 Essential (primary) hypertension: Secondary | ICD-10-CM

## 2014-07-24 DIAGNOSIS — Z1211 Encounter for screening for malignant neoplasm of colon: Secondary | ICD-10-CM

## 2014-07-24 DIAGNOSIS — Z0000 Encounter for general adult medical examination without abnormal findings: Secondary | ICD-10-CM

## 2014-07-24 NOTE — Assessment & Plan Note (Signed)
Chronic, stable. On current regimen.

## 2014-07-24 NOTE — Assessment & Plan Note (Signed)
Preventative protocols reviewed and updated unless pt declined. Discussed healthy diet and lifestyle.  

## 2014-07-24 NOTE — Patient Instructions (Signed)
Good to see you today, you are doing well. Return as needed or in 1 year for next physical. Stool kit today. Watch added sugars.

## 2014-07-24 NOTE — Assessment & Plan Note (Signed)
Stable on current med regimen.   

## 2014-07-24 NOTE — Assessment & Plan Note (Signed)
?  plt related.

## 2014-07-24 NOTE — Assessment & Plan Note (Signed)
Mild on exam. Pt declines meds for now - will continue to monitor.

## 2014-07-24 NOTE — Assessment & Plan Note (Signed)
Chronic, stable. Continue regimen. 

## 2014-07-24 NOTE — Progress Notes (Signed)
Pre visit review using our clinic review tool, if applicable. No additional management support is needed unless otherwise documented below in the visit note. 

## 2014-07-24 NOTE — Assessment & Plan Note (Signed)
Encouraged watching added sugars.

## 2014-07-24 NOTE — Progress Notes (Signed)
BP 140/88 mmHg  Pulse 76  Temp(Src) 98 F (36.7 C) (Tympanic)  Ht 5' 9.5" (1.765 m)  Wt 187 lb (84.823 kg)  BMI 27.23 kg/m2   CC: CPE  Subjective:    Patient ID: Joseph Shea, male    DOB: 04-10-1951, 63 y.o.   MRN: [id number]  HPI: Joseph Shea is a 63 y.o. male presenting on 07/24/2014 for Annual Exam   CAD with 2 stents placed, in stent restenosis 2011.   Preventative:  Colon cancer screening - would like stool kit  Prostate cancer screening - increased nocturia 2-3x. Wants continued testing.  Declines flu  UTD tetanus - 2006 Seat belt use discussed Sunscreen use discussed  Truck driver  Activity: No regular exercise, tries to walk  Diet: fruits/vegetables daily, small amt water   Relevant past medical, surgical, family and social history reviewed and updated as indicated.  Allergies and medications reviewed and updated. Current Outpatient Prescriptions on File Prior to Visit  Medication Sig  . aspirin 81 MG tablet Take 162 mg by mouth daily.   Marland Kitchen lisinopril (PRINIVIL,ZESTRIL) 20 MG tablet Take 20 mg by mouth daily.  . metoprolol tartrate (LOPRESSOR) 25 MG tablet TAKE 1 TABLET BY MOUTH TWICE DAILY  . rosuvastatin (CRESTOR) 20 MG tablet Take 1 tablet (20 mg total) by mouth daily. * Needs appt for fasting labs, then a follow up with Dr for additional quantity/ refills*  . Multiple Vitamin (MULTIVITAMIN PO) Take 1 tablet by mouth daily.    . nitroGLYCERIN (NITROSTAT) 0.4 MG SL tablet Place 0.4 mg under the tongue every 5 (five) minutes as needed.     No current facility-administered medications on file prior to visit.    Review of Systems  Constitutional: Negative for fever, chills, activity change, appetite change, fatigue and unexpected weight change.  HENT: Negative for hearing loss.   Eyes: Negative for visual disturbance.  Respiratory: Negative for cough, chest tightness, shortness of breath and wheezing.   Cardiovascular: Negative for chest pain,  palpitations and leg swelling.  Gastrointestinal: Negative for nausea, vomiting, abdominal pain, diarrhea, constipation, blood in stool and abdominal distention.  Genitourinary: Negative for hematuria and difficulty urinating.  Musculoskeletal: Negative for myalgias, arthralgias and neck pain.  Skin: Negative for rash.  Neurological: Negative for dizziness, seizures, syncope and headaches.  Hematological: Negative for adenopathy. Does not bruise/bleed easily.  Psychiatric/Behavioral: Negative for dysphoric mood. The patient is not nervous/anxious.    Per HPI unless specifically indicated above    Objective:    BP 140/88 mmHg  Pulse 76  Temp(Src) 98 F (36.7 C) (Tympanic)  Ht 5' 9.5" (1.765 m)  Wt 187 lb (84.823 kg)  BMI 27.23 kg/m2  Physical Exam  Constitutional: He is oriented to person, place, and time. He appears well-developed and well-nourished. No distress.  HENT:  Head: Normocephalic and atraumatic.  Right Ear: Hearing, tympanic membrane, external ear and ear canal normal.  Left Ear: Hearing, tympanic membrane, external ear and ear canal normal.  Nose: Nose normal.  Mouth/Throat: Uvula is midline, oropharynx is clear and moist and mucous membranes are normal. No oropharyngeal exudate, posterior oropharyngeal edema or posterior oropharyngeal erythema.  Eyes: Conjunctivae and EOM are normal. Pupils are equal, round, and reactive to light. No scleral icterus.  Neck: Normal range of motion. Neck supple. Carotid bruit is not present. No thyromegaly present.  Cardiovascular: Normal rate, regular rhythm, normal heart sounds and intact distal pulses.   No murmur heard. Pulses:      Radial pulses  are 2+ on the right side, and 2+ on the left side.  Pulmonary/Chest: Effort normal and breath sounds normal. No respiratory distress. He has no wheezes. He has no rales.  Abdominal: Soft. Bowel sounds are normal. He exhibits no distension and no mass. There is no tenderness. There is no  rebound and no guarding.  Genitourinary: Rectum normal. Rectal exam shows no external hemorrhoid, no internal hemorrhoid, no fissure, no mass, no tenderness and anal tone normal. Prostate is enlarged (20gm (mild)). Prostate is not tender.  Musculoskeletal: Normal range of motion. He exhibits no edema.  Lymphadenopathy:    He has no cervical adenopathy.  Neurological: He is alert and oriented to person, place, and time.  CN grossly intact, station and gait intact  Skin: Skin is warm and dry. No rash noted.  Psychiatric: He has a normal mood and affect. His behavior is normal. Judgment and thought content normal.  Nursing note and vitals reviewed.  Results for orders placed or performed in visit on 07/17/14  Lipid panel  Result Value Ref Range   Cholesterol 143 0 - 200 mg/dL   Triglycerides 80.0 0.0 - 149.0 mg/dL   HDL 56.60 >39.00 mg/dL   VLDL 16.0 0.0 - 40.0 mg/dL   LDL Cholesterol 70 0 - 99 mg/dL   Total CHOL/HDL Ratio 3    NonHDL 86.40   Comprehensive metabolic panel  Result Value Ref Range   Sodium 139 135 - 145 mEq/L   Potassium 4.9 3.5 - 5.1 mEq/L   Chloride 105 96 - 112 mEq/L   CO2 30 19 - 32 mEq/L   Glucose, Bld 114 (H) 70 - 99 mg/dL   BUN 15 6 - 23 mg/dL   Creatinine, Ser 1.0 0.4 - 1.5 mg/dL   Total Bilirubin 1.1 0.2 - 1.2 mg/dL   Alkaline Phosphatase 88 39 - 117 U/L   AST 22 0 - 37 U/L   ALT 38 0 - 53 U/L   Total Protein 6.7 6.0 - 8.3 g/dL   Albumin 3.9 3.5 - 5.2 g/dL   Calcium 9.4 8.4 - 10.5 mg/dL   GFR 85.54 >60.00 mL/min  CBC with Differential  Result Value Ref Range   WBC 5.4 4.0 - 10.5 K/uL   RBC 5.10 4.22 - 5.81 Mil/uL   Hemoglobin 16.5 13.0 - 17.0 g/dL   HCT 47.9 39.0 - 52.0 %   MCV 94.0 78.0 - 100.0 fl   MCHC 34.3 30.0 - 36.0 g/dL   RDW 13.6 11.5 - 15.5 %   Platelets 132.0 (L) 150.0 - 400.0 K/uL   Neutrophils Relative % 69.2 43.0 - 77.0 %   Lymphocytes Relative 20.1 12.0 - 46.0 %   Monocytes Relative 8.9 3.0 - 12.0 %   Eosinophils Relative 1.3 0.0 -  5.0 %   Basophils Relative 0.5 0.0 - 3.0 %   Neutro Abs 3.8 1.4 - 7.7 K/uL   Lymphs Abs 1.1 0.7 - 4.0 K/uL   Monocytes Absolute 0.5 0.1 - 1.0 K/uL   Eosinophils Absolute 0.1 0.0 - 0.7 K/uL   Basophils Absolute 0.0 0.0 - 0.1 K/uL  PSA  Result Value Ref Range   PSA 0.51 0.10 - 4.00 ng/mL      Assessment & Plan:   Problem List Items Addressed This Visit    Thrombocytopenia    ?plt related.    HYPERTENSION, BENIGN ESSENTIAL    Chronic, stable. Continue regimen.    Hyperglycemia    Encouraged watching added sugars.    HLD (  hyperlipidemia)    Chronic, stable. On current regimen.    Healthcare maintenance - Primary    Preventative protocols reviewed and updated unless pt declined. Discussed healthy diet and lifestyle.    Coronary atherosclerosis    Stable on current med regimen.    BPH (benign prostatic hypertrophy)    Mild on exam. Pt declines meds for now - will continue to monitor.     Other Visit Diagnoses    Special screening for malignant neoplasms, colon        Relevant Orders       Fecal occult blood, imunochemical        Follow up plan: Return in about 1 year (around 07/25/2015), or as needed, for annual exam, prior fasting for blood work.

## 2014-08-07 ENCOUNTER — Other Ambulatory Visit: Payer: Self-pay | Admitting: Family Medicine

## 2014-08-14 ENCOUNTER — Other Ambulatory Visit: Payer: Managed Care, Other (non HMO)

## 2014-08-14 DIAGNOSIS — Z1211 Encounter for screening for malignant neoplasm of colon: Secondary | ICD-10-CM

## 2014-08-14 LAB — FECAL OCCULT BLOOD, GUAIAC: Fecal Occult Blood: NEGATIVE

## 2014-08-14 LAB — FECAL OCCULT BLOOD, IMMUNOCHEMICAL: Fecal Occult Bld: NEGATIVE

## 2014-08-15 ENCOUNTER — Encounter: Payer: Self-pay | Admitting: *Deleted

## 2014-11-03 ENCOUNTER — Encounter: Payer: Self-pay | Admitting: Cardiovascular Disease

## 2014-11-03 ENCOUNTER — Ambulatory Visit (INDEPENDENT_AMBULATORY_CARE_PROVIDER_SITE_OTHER): Payer: Managed Care, Other (non HMO) | Admitting: Cardiovascular Disease

## 2014-11-03 VITALS — BP 140/90 | HR 67 | Ht 69.5 in | Wt 187.0 lb

## 2014-11-03 DIAGNOSIS — E785 Hyperlipidemia, unspecified: Secondary | ICD-10-CM

## 2014-11-03 DIAGNOSIS — I2510 Atherosclerotic heart disease of native coronary artery without angina pectoris: Secondary | ICD-10-CM

## 2014-11-03 DIAGNOSIS — I10 Essential (primary) hypertension: Secondary | ICD-10-CM

## 2014-11-03 MED ORDER — LISINOPRIL 40 MG PO TABS: 40.0000 mg | ORAL_TABLET | Freq: Every day | ORAL | Status: DC

## 2014-11-03 NOTE — Assessment & Plan Note (Signed)
Cholesterol is at goal on the current lipid regimen. No changes to the medications were made.  

## 2014-11-03 NOTE — Patient Instructions (Addendum)
You are doing well. Please increase the lisinopril up to 40 mg per day  Please call us if you have new issues that need to be addressed before your next appt.  Your physician wants you to follow-up in: 12 months.  You will receive a reminder letter in the mail two months in advance. If you don't receive a letter, please call our office to schedule the follow-up appointment.

## 2014-11-03 NOTE — Assessment & Plan Note (Signed)
Currently with no symptoms of angina. No further workup at this time. Continue current medication regimen. 

## 2014-11-03 NOTE — Assessment & Plan Note (Signed)
For his high blood pressure, we have recommended that he increase lisinopril up to 40 mg daily and to check his blood pressure at home, consider buying a blood pressure cuff
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
## 2014-11-04 ENCOUNTER — Other Ambulatory Visit: Payer: Self-pay | Admitting: Family Medicine

## 2015-07-25 ENCOUNTER — Telehealth: Payer: Self-pay | Admitting: Cardiovascular Disease

## 2015-07-25 NOTE — Telephone Encounter (Signed)
3 attempts to schedule fu from Recall list.  LMOV to call office for scheduling ° ° °Deleting recall.   °

## 2015-09-02 ENCOUNTER — Other Ambulatory Visit: Payer: Self-pay | Admitting: Family Medicine

## 2015-09-03 ENCOUNTER — Other Ambulatory Visit: Payer: Self-pay | Admitting: Family Medicine

## 2015-09-28 ENCOUNTER — Telehealth: Payer: Self-pay | Admitting: Family Medicine

## 2015-09-28 NOTE — Telephone Encounter (Signed)
Pt called he wanted to know if he could get labs done 10/15/15  He is taking crestor and it time for refill

## 2015-10-01 NOTE — Telephone Encounter (Signed)
Would rec schedule CPE as due.

## 2015-10-04 NOTE — Telephone Encounter (Signed)
Left message asking pt to call office  °

## 2015-10-09 NOTE — Telephone Encounter (Signed)
Scheduled cpe 1/24 and labs 1/23.

## 2015-10-15 ENCOUNTER — Other Ambulatory Visit (INDEPENDENT_AMBULATORY_CARE_PROVIDER_SITE_OTHER): Payer: PRIVATE HEALTH INSURANCE

## 2015-10-15 ENCOUNTER — Ambulatory Visit (INDEPENDENT_AMBULATORY_CARE_PROVIDER_SITE_OTHER): Payer: PRIVATE HEALTH INSURANCE | Admitting: Cardiovascular Disease

## 2015-10-15 ENCOUNTER — Encounter: Payer: Self-pay | Admitting: Cardiovascular Disease

## 2015-10-15 ENCOUNTER — Other Ambulatory Visit: Payer: Self-pay | Admitting: Family Medicine

## 2015-10-15 VITALS — BP 140/90 | HR 63 | Ht 69.5 in | Wt 185.0 lb

## 2015-10-15 DIAGNOSIS — I10 Essential (primary) hypertension: Secondary | ICD-10-CM

## 2015-10-15 DIAGNOSIS — E785 Hyperlipidemia, unspecified: Secondary | ICD-10-CM

## 2015-10-15 DIAGNOSIS — N400 Enlarged prostate without lower urinary tract symptoms: Secondary | ICD-10-CM | POA: Diagnosis not present

## 2015-10-15 DIAGNOSIS — D696 Thrombocytopenia, unspecified: Secondary | ICD-10-CM

## 2015-10-15 DIAGNOSIS — Z029 Encounter for administrative examinations, unspecified: Secondary | ICD-10-CM

## 2015-10-15 DIAGNOSIS — Z024 Encounter for examination for driving license: Secondary | ICD-10-CM | POA: Insufficient documentation

## 2015-10-15 DIAGNOSIS — I2510 Atherosclerotic heart disease of native coronary artery without angina pectoris: Secondary | ICD-10-CM | POA: Diagnosis not present

## 2015-10-15 LAB — COMPREHENSIVE METABOLIC PANEL
ALT: 26 U/L (ref 0–53)
AST: 16 U/L (ref 0–37)
Albumin: 4.6 g/dL (ref 3.5–5.2)
Alkaline Phosphatase: 93 U/L (ref 39–117)
BILIRUBIN TOTAL: 1.3 mg/dL — AB (ref 0.2–1.2)
BUN: 20 mg/dL (ref 6–23)
CHLORIDE: 103 meq/L (ref 96–112)
CO2: 30 mEq/L (ref 19–32)
CREATININE: 0.93 mg/dL (ref 0.40–1.50)
Calcium: 9.4 mg/dL (ref 8.4–10.5)
GFR: 87.31 mL/min (ref >60.00)
Glucose, Bld: 120 mg/dL — ABNORMAL HIGH (ref 70–99)
Potassium: 4.7 mEq/L (ref 3.5–5.1)
Sodium: 139 mEq/L (ref 135–145)
TOTAL PROTEIN: 6.6 g/dL (ref 6.0–8.3)

## 2015-10-15 LAB — CBC WITH DIFFERENTIAL/PLATELET
BASOS PCT: 0.7 % (ref 0.0–3.0)
Basophils Absolute: 0 10*3/uL (ref 0.0–0.1)
EOS ABS: 0.1 10*3/uL (ref 0.0–0.7)
EOS PCT: 1.1 % (ref 0.0–5.0)
HEMATOCRIT: 49.6 % (ref 39.0–52.0)
HEMOGLOBIN: 17.1 g/dL — AB (ref 13.0–17.0)
Lymphocytes Relative: 20.7 % (ref 12.0–46.0)
Lymphs Abs: 1.2 10*3/uL (ref 0.7–4.0)
MCHC: 34.5 g/dL (ref 30.0–36.0)
MCV: 91.9 fl (ref 78.0–100.0)
MONOS PCT: 8.9 % (ref 3.0–12.0)
Monocytes Absolute: 0.5 10*3/uL (ref 0.1–1.0)
Neutro Abs: 4 10*3/uL (ref 1.4–7.7)
Neutrophils Relative %: 68.6 % (ref 43.0–77.0)
PLATELETS: 139 10*3/uL — AB (ref 150.0–400.0)
RBC: 5.4 Mil/uL (ref 4.22–5.81)
RDW: 13.1 % (ref 11.5–15.5)
WBC: 5.8 10*3/uL (ref 4.0–10.5)

## 2015-10-15 LAB — LIPID PANEL
CHOL/HDL RATIO: 3
Cholesterol: 156 mg/dL (ref 0–200)
HDL: 57.4 mg/dL (ref >39.00)
NONHDL: 98.85
TRIGLYCERIDES: 201 mg/dL — AB (ref 0.0–149.0)
VLDL: 40.2 mg/dL — ABNORMAL HIGH (ref 0.0–40.0)

## 2015-10-15 LAB — LDL CHOLESTEROL, DIRECT: Direct LDL: 67 mg/dL

## 2015-10-15 LAB — PSA: PSA: 0.46 ng/mL (ref 0.10–4.00)

## 2015-10-15 MED ORDER — AMLODIPINE BESYLATE 5 MG PO TABS: 5.0000 mg | ORAL_TABLET | Freq: Every day | ORAL | Status: DC

## 2015-10-15 NOTE — Assessment & Plan Note (Addendum)
Currently with no symptoms of angina. No further workup at this time. Continue current medication regimen. 

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
## 2015-10-15 NOTE — Patient Instructions (Addendum)
You are doing well.  We will schedule you for a stress test for DOT physical (Treadmill)  Date & time: ___________________________________ Please do not take your metoprolol the night before or morning of your test  Please start amlodipine one a day for blood pressure  Please call us if you have new issues that need to be addressed before your next appt.  Your physician wants you to follow-up in: 12 months.  You will receive a reminder letter in the mail two months in advance. If you don't receive a letter, please call our office to schedule the follow-up appointment.  Exercise Stress Electrocardiogram An exercise stress electrocardiogram is a test that is done to evaluate the blood supply to your heart. This test may also be called exercise stress electrocardiography. The test is done while you are walking on a treadmill. The goal of this test is to raise your heart rate. This test is done to find areas of poor blood flow to the heart by determining the extent of coronary artery disease (CAD).   CAD is defined as narrowing in one or more heart (coronary) arteries of more than 70%. If you have an abnormal test result, this may mean that you are not getting adequate blood flow to your heart during exercise. Additional testing may be needed to understand why your test was abnormal. LET [facility] CARE PROVIDER KNOW ABOUT:   Any allergies you have.  All medicines you are taking, including vitamins, herbs, eye drops, creams, and over-the-counter medicines.  Previous problems you or members of your family have had with the use of anesthetics.  Any blood disorders you have.  Previous surgeries you have had.  Medical conditions you have.  Possibility of pregnancy, if this applies. RISKS AND COMPLICATIONS Generally, this is a safe procedure. However, as with any procedure, complications can occur. Possible complications can include:  Pain or pressure in the following  areas:  Chest.  Jaw or neck.  Between your shoulder blades.  Radiating down your left arm.  Dizziness or light-headedness.  Shortness of breath.  Increased or irregular heartbeats.  Nausea or vomiting.  Heart attack (rare). BEFORE THE PROCEDURE  Avoid all forms of caffeine 24 hours before your test or as directed by your health care provider. This includes coffee, tea (even decaffeinated tea), caffeinated sodas, chocolate, cocoa, and certain pain medicines.  Follow your health care provider's instructions regarding eating and drinking before the test.  Take your medicines as directed at regular times with water unless instructed otherwise. Exceptions may include:  If you have diabetes, ask how you are to take your insulin or pills. It is common to adjust insulin dosing the morning of the test.  If you are taking beta-blocker medicines, it is important to talk to your health care provider about these medicines well before the date of your test. Taking beta-blocker medicines may interfere with the test. In some cases, these medicines need to be changed or stopped 24 hours or more before the test.  If you wear a nitroglycerin patch, it may need to be removed prior to the test. Ask your health care provider if the patch should be removed before the test.  If you use an inhaler for any breathing condition, bring it with you to the test.  If you are an outpatient, bring a snack so you can eat right after the stress phase of the test.  Do not smoke for 4 hours prior to the test or as directed by  your health care provider.  Do not apply lotions, powders, creams, or oils on your chest prior to the test.  Wear loose-fitting clothes and comfortable shoes for the test. This test involves walking on a treadmill. PROCEDURE  Multiple patches (electrodes) will be put on your chest. If needed, small areas of your chest may have to be shaved to get better contact with the electrodes. Once  the electrodes are attached to your body, multiple wires will be attached to the electrodes and your heart rate will be monitored.  Your heart will be monitored both at rest and while exercising.  You will walk on a treadmill. The treadmill will be started at a slow pace. The treadmill speed and incline will gradually be increased to raise your heart rate. AFTER THE PROCEDURE  Your heart rate and blood pressure will be monitored after the test.  You may return to your normal schedule including diet, activities, and medicines, unless your health care provider tells you otherwise.   This information is not intended to replace advice given to you by your health care provider. Make sure you discuss any questions you have with your health care provider.   Document Released: 09/05/2000 Document Revised: 09/13/2013 Document Reviewed: 05/16/2013 Elsevier Interactive Patient Education [employer]

## 2015-10-15 NOTE — Assessment & Plan Note (Signed)
Blood pressure borderline elevated He is concerned this will be a problem for DOT physical and requesting medication changes Recommended he start amlodipine 5 mg daily If blood pressure continues to run high after one week, this could be increased up to 10 mg daily

## 2015-10-15 NOTE — Assessment & Plan Note (Signed)
Cholesterol is at goal on the current lipid regimen. No changes to the medications were made.  

## 2015-10-15 NOTE — Assessment & Plan Note (Signed)
Blood pressure reviewed with him, medication changes made. Stress test ordered, we will hold metoprolol night before, morning of the treadmill study Details of the test discussed with him in detail

## 2015-10-16 ENCOUNTER — Ambulatory Visit (INDEPENDENT_AMBULATORY_CARE_PROVIDER_SITE_OTHER): Payer: PRIVATE HEALTH INSURANCE | Admitting: Family Medicine

## 2015-10-16 ENCOUNTER — Encounter: Payer: Self-pay | Admitting: Family Medicine

## 2015-10-16 VITALS — BP 138/88 | HR 80 | Temp 97.6°F | Wt 184.5 lb

## 2015-10-16 DIAGNOSIS — Z1211 Encounter for screening for malignant neoplasm of colon: Secondary | ICD-10-CM | POA: Diagnosis not present

## 2015-10-16 DIAGNOSIS — Z0000 Encounter for general adult medical examination without abnormal findings: Secondary | ICD-10-CM

## 2015-10-16 DIAGNOSIS — N400 Enlarged prostate without lower urinary tract symptoms: Secondary | ICD-10-CM | POA: Diagnosis not present

## 2015-10-16 DIAGNOSIS — E785 Hyperlipidemia, unspecified: Secondary | ICD-10-CM | POA: Diagnosis not present

## 2015-10-16 DIAGNOSIS — Z23 Encounter for immunization: Secondary | ICD-10-CM

## 2015-10-16 DIAGNOSIS — R739 Hyperglycemia, unspecified: Secondary | ICD-10-CM

## 2015-10-16 DIAGNOSIS — D696 Thrombocytopenia, unspecified: Secondary | ICD-10-CM

## 2015-10-16 DIAGNOSIS — I10 Essential (primary) hypertension: Secondary | ICD-10-CM

## 2015-10-16 MED ORDER — ROSUVASTATIN CALCIUM 20 MG PO TABS: 20.0000 mg | ORAL_TABLET | Freq: Every day | ORAL | Status: DC

## 2015-10-16 NOTE — Progress Notes (Signed)
BP 138/88 mmHg  Pulse 80  Temp(Src) 97.6 F (36.4 C) (Oral)  Wt 184 lb 8 oz (83.689 kg)   CC: CPE  Subjective:    Patient ID: Joseph Shea, male    DOB: January 04, 1951, 65 y.o.   MRN: [id number]  HPI: Joseph Shea is a 65 y.o. male presenting on 10/16/2015 for Annual Exam   CAD with 2 stents placed, in stent restenosis 2011.  Yesterday started on amlodipine 50m daily by Dr GRockey Situ Tends to stress about bp during DOT physicals.  Noticing some joint pains.   Preventative: Colon cancer screening - would like stool kit  Prostate cancer screening - increased nocturia 2-3x. Wants continued testing.  Declines flu  UTD tetanus - 2006 Seat belt use discussed Sunscreen use discussed. No changing moles on skin  Lives with wife.  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 Interim medical history since our last visit reviewed. Allergies and medications reviewed and updated. Current Outpatient Prescriptions on File Prior to Visit  Medication Sig  . amLODipine (NORVASC) 5 MG tablet Take 1 tablet (5 mg total) by mouth daily.  .Marland Kitchenaspirin 81 MG tablet Take 162 mg by mouth daily.   .Marland Kitchenlisinopril (PRINIVIL,ZESTRIL) 40 MG tablet Take 1 tablet (40 mg total) by mouth daily.  . metoprolol tartrate (LOPRESSOR) 25 MG tablet TAKE 1 TABLET BY MOUTH TWICE DAILY  . Multiple Vitamin (MULTIVITAMIN PO) Take 1 tablet by mouth daily.    . nitroGLYCERIN (NITROSTAT) 0.4 MG SL tablet Place 0.4 mg under the tongue every 5 (five) minutes as needed. Reported on 10/16/2015   No current facility-administered medications on file prior to visit.    Review of Systems  Constitutional: Negative for fever, chills, activity change, appetite change, fatigue and unexpected weight change.  HENT: Negative for hearing loss.    Eyes: Negative for visual disturbance.  Respiratory: Negative for cough, chest tightness, shortness of breath and wheezing.   Cardiovascular: Negative for chest pain, palpitations and leg swelling.  Gastrointestinal: Negative for nausea, vomiting, abdominal pain, diarrhea, constipation, blood in stool and abdominal distention.  Genitourinary: Negative for hematuria and difficulty urinating.  Musculoskeletal: Negative for myalgias, arthralgias and neck pain.  Skin: Negative for rash.  Neurological: Negative for dizziness, seizures, syncope and headaches.  Hematological: Negative for adenopathy. Does not bruise/bleed easily.  Psychiatric/Behavioral: Negative for dysphoric mood. The patient is not nervous/anxious.    Per HPI unless specifically indicated in ROS section     Objective:    BP 138/88 mmHg  Pulse 80  Temp(Src) 97.6 F (36.4 C) (Oral)  Wt 184 lb 8 oz (83.689 kg)  Wt Readings from Last 3 Encounters:  10/16/15 184 lb 8 oz (83.689 kg)  10/15/15 185 lb (83.915 kg)  11/03/14 187 lb (84.823 kg)    Physical Exam  Constitutional: He is oriented to person, place, and time. He appears well-developed and well-nourished. No distress.  HENT:  Head: Normocephalic and atraumatic.  Right Ear: Hearing, tympanic membrane, external ear and ear canal normal.  Left Ear: Hearing, tympanic membrane, external ear and ear canal normal.  Nose: Nose normal.  Mouth/Throat: Uvula is midline, oropharynx is clear and moist and mucous membranes are normal. No oropharyngeal exudate, posterior oropharyngeal edema or posterior oropharyngeal erythema.  Eyes: Conjunctivae and EOM are normal. Pupils are equal, round, and reactive  to light. No scleral icterus.  Neck: Normal range of motion. Neck supple. Carotid bruit is not present. No thyromegaly present.  Cardiovascular: Normal rate, regular rhythm, normal heart sounds and intact distal pulses.   No murmur heard. Pulses:      Radial pulses are 2+ on the  right side, and 2+ on the left side.  Pulmonary/Chest: Effort normal and breath sounds normal. No respiratory distress. He has no wheezes. He has no rales.  Abdominal: Soft. Bowel sounds are normal. He exhibits no distension and no mass. There is no tenderness. There is no rebound and no guarding.  Genitourinary: Rectum normal and prostate normal. Rectal exam shows no external hemorrhoid, no internal hemorrhoid, no fissure, no mass, no tenderness and anal tone normal. Prostate is not enlarged (20 gm) and not tender.  Musculoskeletal: Normal range of motion. He exhibits no edema.  Lymphadenopathy:    He has no cervical adenopathy.  Neurological: He is alert and oriented to person, place, and time.  CN grossly intact, station and gait intact  Skin: Skin is warm and dry. No rash noted.  Psychiatric: He has a normal mood and affect. His behavior is normal. Judgment and thought content normal.  Nursing note and vitals reviewed.  Results for orders placed or performed in visit on 10/15/15  Lipid panel  Result Value Ref Range   Cholesterol 156 0 - 200 mg/dL   Triglycerides 201.0 (H) 0.0 - 149.0 mg/dL   HDL 57.40 >39.00 mg/dL   VLDL 40.2 (H) 0.0 - 40.0 mg/dL   Total CHOL/HDL Ratio 3    NonHDL 98.85   Comprehensive metabolic panel  Result Value Ref Range   Sodium 139 135 - 145 mEq/L   Potassium 4.7 3.5 - 5.1 mEq/L   Chloride 103 96 - 112 mEq/L   CO2 30 19 - 32 mEq/L   Glucose, Bld 120 (H) 70 - 99 mg/dL   BUN 20 6 - 23 mg/dL   Creatinine, Ser 0.93 0.40 - 1.50 mg/dL   Total Bilirubin 1.3 (H) 0.2 - 1.2 mg/dL   Alkaline Phosphatase 93 39 - 117 U/L   AST 16 0 - 37 U/L   ALT 26 0 - 53 U/L   Total Protein 6.6 6.0 - 8.3 g/dL   Albumin 4.6 3.5 - 5.2 g/dL   Calcium 9.4 8.4 - 10.5 mg/dL   GFR 87.31 >60.00 mL/min  PSA  Result Value Ref Range   PSA 0.46 0.10 - 4.00 ng/mL  CBC with Differential/Platelet  Result Value Ref Range   WBC 5.8 4.0 - 10.5 K/uL   RBC 5.40 4.22 - 5.81 Mil/uL    Hemoglobin 17.1 (H) 13.0 - 17.0 g/dL   HCT 49.6 39.0 - 52.0 %   MCV 91.9 78.0 - 100.0 fl   MCHC 34.5 30.0 - 36.0 g/dL   RDW 13.1 11.5 - 15.5 %   Platelets 139.0 (L) 150.0 - 400.0 K/uL   Neutrophils Relative % 68.6 43.0 - 77.0 %   Lymphocytes Relative 20.7 12.0 - 46.0 %   Monocytes Relative 8.9 3.0 - 12.0 %   Eosinophils Relative 1.1 0.0 - 5.0 %   Basophils Relative 0.7 0.0 - 3.0 %   Neutro Abs 4.0 1.4 - 7.7 K/uL   Lymphs Abs 1.2 0.7 - 4.0 K/uL   Monocytes Absolute 0.5 0.1 - 1.0 K/uL   Eosinophils Absolute 0.1 0.0 - 0.7 K/uL   Basophils Absolute 0.0 0.0 - 0.1 K/uL  LDL cholesterol, direct  Result Value Ref  Range   Direct LDL 67.0 mg/dL      Assessment & Plan:   Problem List Items Addressed This Visit    Thrombocytopenia ([place])    ?asa related. Mild, continue to monitor.      HYPERTENSION, BENIGN ESSENTIAL    Chronic, stable. Continue current regimen. Recently started on amlodipine 28m daily.      Relevant Medications   rosuvastatin (CRESTOR) 20 MG tablet   Hyperglycemia    Reviewed with patient - decrease ice cream.      HLD (hyperlipidemia)    Chronic, stable. Continue rosuvastatin and fish oil. Discussed diet changes to improve trig.      Relevant Medications   rosuvastatin (CRESTOR) 20 MG tablet   Healthcare maintenance - Primary    Preventative protocols reviewed and updated unless pt declined. Discussed healthy diet and lifestyle.       BPH (benign prostatic hypertrophy)    Minimal.        Other Visit Diagnoses    Special screening for malignant neoplasms, colon        Relevant Orders    Fecal occult blood, imunochemical        Follow up plan: Return in about 1 year (around 10/15/2016), or as need , for annual exam, prior fasting for blood work.

## 2015-10-16 NOTE — Assessment & Plan Note (Signed)
Preventative protocols reviewed and updated unless pt declined. Discussed healthy diet and lifestyle.  

## 2015-10-16 NOTE — Addendum Note (Signed)
Addended by: Josph Macho A on: 10/16/2015 04:34 PM   Modules accepted: Orders

## 2015-10-16 NOTE — Assessment & Plan Note (Signed)
Reviewed with patient - decrease ice cream.

## 2015-10-16 NOTE — Addendum Note (Signed)
Addended by: Eustaquio Boyden on: 10/16/2015 05:49 PM   Modules accepted: Kipp Brood

## 2015-10-16 NOTE — Assessment & Plan Note (Signed)
Chronic, stable. Continue current regimen. Recently started on amlodipine  daily.

## 2015-10-16 NOTE — Patient Instructions (Addendum)
Pass by lab for stool kit.  Tdap today.  Watch added sugars. Work on lowering sugar and triglycerides.  Check Hep C next labwork.  Return as needed or in 1 year for next physical.       Mediterranean Diet  Why follow it? Research shows. . Those who follow the Mediterranean diet have a reduced risk of heart disease  . The diet is associated with a reduced incidence of Parkinson's and Alzheimer's diseases . People following the diet may have longer life expectancies and lower rates of chronic diseases  . The Dietary Guidelines for Americans recommends the Mediterranean diet as an eating plan to promote health and prevent disease  What Is the Mediterranean Diet?  . Healthy eating plan based on typical foods and recipes of Mediterranean-style cooking . The diet is primarily a plant based diet; these foods should make up a majority of meals   Starches - Plant based foods should make up a majority of meals - They are an important sources of vitamins, minerals, energy, antioxidants, and fiber - Choose whole grains, foods high in fiber and minimally processed items  - Typical grain sources include wheat, oats, barley, corn, brown rice, bulgar, farro, millet, polenta, couscous  - Various types of beans include chickpeas, lentils, fava beans, black beans, white beans   Fruits  Veggies - Large quantities of antioxidant rich fruits & veggies; 6 or more servings  - Vegetables can be eaten raw or lightly drizzled with oil and cooked  - Vegetables common to the traditional Mediterranean Diet include: artichokes, arugula, beets, broccoli, brussel sprouts, cabbage, carrots, celery, collard greens, cucumbers, eggplant, kale, leeks, lemons, lettuce, mushrooms, okra, onions, peas, peppers, potatoes, pumpkin, radishes, rutabaga, shallots, spinach, sweet potatoes, turnips, zucchini - Fruits common to the Mediterranean Diet include: apples, apricots, avocados, cherries, clementines, dates, figs, grapefruits,  grapes, melons, nectarines, oranges, peaches, pears, pomegranates, strawberries, tangerines  Fats - Replace butter and margarine with healthy oils, such as olive oil, canola oil, and tahini  - Limit nuts to no more than a handful a day  - Nuts include walnuts, almonds, pecans, pistachios, pine nuts  - Limit or avoid candied, honey roasted or heavily salted nuts - Olives are central to the [employer] - can be eaten whole or used in a variety of dishes   Meats Protein - Limiting red meat: no more than a few times a month - When eating red meat: choose lean cuts and keep the portion to the size of deck of cards - Eggs: approx. 0 to 4 times a week  - Fish and lean poultry: at least 2 a week  - Healthy protein sources include, chicken, [place], lean beef, lamb - Increase intake of seafood such as tuna, salmon, trout, mackerel, shrimp, scallops - Avoid or limit high fat processed meats such as sausage and bacon  Dairy - Include moderate amounts of low fat dairy products  - Focus on healthy dairy such as fat free yogurt, skim milk, low or reduced fat cheese - Limit dairy products higher in fat such as whole or 2% milk, cheese, ice cream  Alcohol - Moderate amounts of red wine is ok  - No more than 5 oz daily for women (all ages) and men older than age 79  - No more than 10 oz of wine daily for men younger than 38  Other - Limit sweets and other desserts  - Use herbs and spices instead of salt to flavor foods  - Herbs  and spices common to the traditional Mediterranean Diet include: basil, bay leaves, chives, cloves, cumin, fennel, garlic, lavender, marjoram, mint, oregano, parsley, pepper, rosemary, sage, savory, sumac, tarragon, thyme   It's not just a diet, it's a lifestyle:  . The Mediterranean diet includes lifestyle factors typical of those in the region  . Foods, drinks and meals are best eaten with others and savored . Daily physical activity is important for overall good  health . This could be strenuous exercise like running and aerobics . This could also be more leisurely activities such as walking, housework, yard-work, or taking the stairs . Moderation is the key; a balanced and healthy diet accommodates most foods and drinks . Consider portion sizes and frequency of consumption of certain foods   Meal Ideas & Options:  . Breakfast:  o Whole wheat toast or whole wheat English muffins with peanut butter & hard boiled egg o Steel cut oats topped with apples & cinnamon and skim milk  o Fresh fruit: banana, strawberries, melon, berries, peaches  o Smoothies: strawberries, bananas, greek yogurt, peanut butter o Low fat greek yogurt with blueberries and granola  o Egg white omelet with spinach and mushrooms o Breakfast couscous: whole wheat couscous, apricots, skim milk, cranberries  . Sandwiches:  o Hummus and grilled vegetables (peppers, zucchini, squash) on whole wheat bread   o Grilled chicken on whole wheat pita with lettuce, tomatoes, cucumbers or tzatziki  o Tuna salad on whole wheat bread: tuna salad made with greek yogurt, olives, red peppers, capers, green onions o Garlic rosemary lamb pita: lamb sauted with garlic, rosemary, salt & pepper; add lettuce, cucumber, greek yogurt to pita - flavor with lemon juice and black pepper  . Seafood:  o Mediterranean grilled salmon, seasoned with garlic, basil, parsley, lemon juice and black pepper o Shrimp, lemon, and spinach whole-grain pasta salad made with low fat greek yogurt  o Seared scallops with lemon orzo  o Seared tuna steaks seasoned salt, pepper, coriander topped with tomato mixture of olives, tomatoes, olive oil, minced garlic, parsley, green onions and cappers  . Meats:  o Herbed greek chicken salad with kalamata olives, cucumber, feta  o Red bell peppers stuffed with spinach, bulgur, lean ground beef (or lentils) & topped with feta   o Kebabs: skewers of chicken, tomatoes, onions, zucchini,  squash  o [place] burgers: made with red onions, mint, dill, lemon juice, feta cheese topped with roasted red peppers . Vegetarian o Cucumber salad: cucumbers, artichoke hearts, celery, red onion, feta cheese, tossed in olive oil & lemon juice  o Hummus and whole grain pita points with a greek salad (lettuce, tomato, feta, olives, cucumbers, red onion) o Lentil soup with celery, carrots made with vegetable broth, garlic, salt and pepper  o Tabouli salad: parsley, bulgur, mint, scallions, cucumbers, tomato, radishes, lemon juice, olive oil, salt and pepper.

## 2015-10-16 NOTE — Assessment & Plan Note (Signed)
?  asa related. Mild, continue to monitor.

## 2015-10-16 NOTE — Assessment & Plan Note (Signed)
Minimal

## 2015-10-16 NOTE — Progress Notes (Signed)
Pre visit review using our clinic review tool, if applicable. No additional management support is needed unless otherwise documented below in the visit note. 

## 2015-10-16 NOTE — Assessment & Plan Note (Signed)
Chronic, stable. Continue rosuvastatin and fish oil. Discussed diet changes to improve trig.

## 2015-10-19 ENCOUNTER — Other Ambulatory Visit: Payer: Self-pay | Admitting: Family Medicine

## 2015-10-26 ENCOUNTER — Ambulatory Visit (INDEPENDENT_AMBULATORY_CARE_PROVIDER_SITE_OTHER): Payer: PRIVATE HEALTH INSURANCE

## 2015-10-26 DIAGNOSIS — I10 Essential (primary) hypertension: Secondary | ICD-10-CM | POA: Diagnosis not present

## 2015-10-26 DIAGNOSIS — I2510 Atherosclerotic heart disease of native coronary artery without angina pectoris: Secondary | ICD-10-CM | POA: Diagnosis not present

## 2015-10-26 LAB — EXERCISE TOLERANCE TEST
CSEPED: 5 min
CSEPEW: 7 METS
CSEPPHR: 137 {beats}/min
Exercise duration (sec): 30 s
MPHR: 158 {beats}/min
Percent HR: 86 %
Rest HR: 85 {beats}/min

## 2015-10-29 ENCOUNTER — Telehealth: Payer: Self-pay

## 2015-10-29 NOTE — Telephone Encounter (Signed)
Pt would like stress test results.  

## 2015-10-29 NOTE — Telephone Encounter (Signed)
Please see result note 

## 2015-10-29 NOTE — Telephone Encounter (Signed)
Pt requests result to be mailed to his home, along w/ EF. Advised him that the test performed does not show his EF. A ECHO or nuclear stress test would show this, but he is not symptomatic so he is unsure if ins will pay for either of these. He will send his GXT results in to the [place] [employer] and call if he need additional info.

## 2015-11-15 ENCOUNTER — Other Ambulatory Visit: Payer: PRIVATE HEALTH INSURANCE

## 2015-11-15 DIAGNOSIS — Z1211 Encounter for screening for malignant neoplasm of colon: Secondary | ICD-10-CM

## 2015-11-15 LAB — FECAL OCCULT BLOOD, GUAIAC: FECAL OCCULT BLD: NEGATIVE

## 2015-11-15 LAB — FECAL OCCULT BLOOD, IMMUNOCHEMICAL: FECAL OCCULT BLD: NEGATIVE

## 2015-11-19 ENCOUNTER — Encounter: Payer: Self-pay | Admitting: *Deleted

## 2016-03-04 ENCOUNTER — Other Ambulatory Visit: Payer: Self-pay | Admitting: *Deleted

## 2016-03-04 MED ORDER — LISINOPRIL 40 MG PO TABS: 40.0000 mg | ORAL_TABLET | Freq: Every day | ORAL | Status: DC

## 2016-03-04 NOTE — Telephone Encounter (Signed)
Requested Prescriptions   Signed Prescriptions Disp Refills  . lisinopril (PRINIVIL,ZESTRIL) 40 MG tablet 90 tablet 3    Sig: Take 1 tablet (40 mg total) by mouth daily.    Authorizing Provider: Antonieta IbaGOLLAN, TIMOTHY J    Ordering User: Kendrick FriesLOPEZ, MARINA C

## 2016-10-17 ENCOUNTER — Ambulatory Visit (INDEPENDENT_AMBULATORY_CARE_PROVIDER_SITE_OTHER): Payer: PRIVATE HEALTH INSURANCE | Admitting: Cardiovascular Disease

## 2016-10-17 ENCOUNTER — Encounter: Payer: Self-pay | Admitting: Cardiovascular Disease

## 2016-10-17 VITALS — BP 130/82 | HR 78 | Ht 69.5 in | Wt 189.5 lb

## 2016-10-17 DIAGNOSIS — I2510 Atherosclerotic heart disease of native coronary artery without angina pectoris: Secondary | ICD-10-CM | POA: Diagnosis not present

## 2016-10-17 DIAGNOSIS — Z024 Encounter for examination for driving license: Secondary | ICD-10-CM

## 2016-10-17 DIAGNOSIS — Z0289 Encounter for other administrative examinations: Secondary | ICD-10-CM

## 2016-10-17 DIAGNOSIS — I10 Essential (primary) hypertension: Secondary | ICD-10-CM

## 2016-10-17 DIAGNOSIS — E782 Mixed hyperlipidemia: Secondary | ICD-10-CM
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
## 2016-10-17 NOTE — Patient Instructions (Addendum)
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 Please call us if you have new issues that need to be addressed before your next appt.  [phone]  Your physician wants you to follow-up in: 12 months.  You will receive a reminder letter in the mail two months in advance. If you don't receive a letter, please call our office to schedule the follow-up appointment.  If you need a refill on your cardiac medications before your next appointment, please call your pharmacy.     Exercise Stress Echocardiogram An exercise stress echocardiogram is a test that checks how well your heart is working. For this test, you will walk on a treadmill to make your heart beat faster. This test uses sound waves (ultrasound) and a computer to make pictures (images) of your heart. These pictures will be taken before you exercise and after you exercise. What happens before the procedure?  Follow instructions from your doctor about what you cannot eat or drink before the test.  Do not drink or eat anything that has caffeine in it. Stop having caffeine for 24 hours before the test.  Ask your doctor about changing or stopping your normal medicines. This is important if you take diabetes medicines or blood thinners. Ask your doctor if you should take your medicines with water before the test.  If you use an inhaler, bring it to the test.  Do not use any products that have nicotine or tobacco in them, such as cigarettes and e-cigarettes. Stop using them for 4 hours before the test. If you need help quitting, ask your doctor.  Wear  comfortable shoes and clothing. What happens during the procedure?  You will be hooked up to a TV screen. Your doctor will watch the screen to see how fast your heart beats during the test.  Before you exercise, a computer will make a picture of your heart. To do this:  A gel will be put on your chest.  A wand will be moved over the gel.  Sound waves from the wand will go to the computer to make the picture.  Your will start walking on a treadmill. The treadmill will start at a slow speed. It will get faster a little bit at a time. When you walk faster, your heart will beat faster.  The treadmill will be stopped when your heart is working hard.  You will lie down right away so another picture of your heart can be taken.  The test will take 30-60 minutes. What happens after the procedure?  Your heart rate and blood pressure will be watched after the test.  If your doctor says that you can, you may:  Eat what you usually eat.  Do your normal activities.  Take medicines like normal. Summary  An exercise stress echocardiogram is a test that checks how well your heart is working.  Follow instructions about what you cannot eat or drink before the test. Ask your doctor if you should take your normal medicines before the test.  Stop having caffeine for 24 hours before the test. Do not use  anything with nicotine or tobacco in it for 4 hours before the test.  A computer will take a picture of your heart before you walk on a treadmill. It will take another picture when you are done walking.  Your heart rate and blood pressure will be watched after the test. This information is not intended to replace advice given to you by your health care provider. Make sure you discuss any questions you have with your health care provider. Document Released: 07/06/2009 Document Revised: 06/01/2016 Document Reviewed: 06/01/2016 Elsevier Interactive Patient Education  2017 [employer].

## 2016-10-29 ENCOUNTER — Other Ambulatory Visit: Payer: Self-pay | Admitting: Family Medicine

## 2016-11-07 ENCOUNTER — Other Ambulatory Visit: Payer: Self-pay | Admitting: Family Medicine

## 2016-12-05 ENCOUNTER — Other Ambulatory Visit: Payer: Self-pay | Admitting: Family Medicine

## 2016-12-08 ENCOUNTER — Encounter: Payer: Self-pay | Admitting: *Deleted

## 2017-01-08 ENCOUNTER — Other Ambulatory Visit: Payer: Self-pay | Admitting: Family Medicine

## 2017-03-27 ENCOUNTER — Other Ambulatory Visit: Payer: Self-pay | Admitting: Cardiovascular Disease

## 2017-06-16 ENCOUNTER — Telehealth: Payer: Self-pay

## 2017-06-16 MED ORDER — AMLODIPINE BESYLATE 5 MG PO TABS: 5.0000 mg | ORAL_TABLET | Freq: Every day | ORAL | 0 refills | Status: DC

## 2017-06-16 MED ORDER — METOPROLOL TARTRATE 25 MG PO TABS: 25.0000 mg | ORAL_TABLET | Freq: Two times a day (BID) | ORAL | 0 refills | Status: DC

## 2017-06-16 MED ORDER — ROSUVASTATIN CALCIUM 20 MG PO TABS: 20.0000 mg | ORAL_TABLET | Freq: Every day | ORAL | 0 refills | Status: DC

## 2017-06-16 NOTE — Telephone Encounter (Signed)
Pt left v/m; pt last seen annual 10/16/15; pt scheduled "CPX 07/20/17. Pt request to reinstate prescriptions for Crestor and BP med; pt has not been on meds for 6 months..Please advise. Unable to reach pt to find out which BP meds pt has not been taking and to find out a pharmacy. Unable to reach pt.Please advise.

## 2017-06-16 NOTE — Telephone Encounter (Signed)
plz notify crestor, amlodipine, metoprolol have been refilled x 3 months to [facility].  Should have lisinopril recently filled from [facility].

## 2017-06-16 NOTE — Telephone Encounter (Signed)
Spoke with pt, relayed message per Dr. Anne Ng he will pick them up and will see Dr. Reece Agar for CPE in Oct.

## 2017-07-12 ENCOUNTER — Other Ambulatory Visit: Payer: Self-pay | Admitting: Family Medicine

## 2017-07-12 DIAGNOSIS — Z1159 Encounter for screening for other viral diseases: Secondary | ICD-10-CM

## 2017-07-12 DIAGNOSIS — D751 Secondary polycythemia: Secondary | ICD-10-CM

## 2017-07-12 DIAGNOSIS — E782 Mixed hyperlipidemia: Secondary | ICD-10-CM

## 2017-07-12 DIAGNOSIS — N400 Enlarged prostate without lower urinary tract symptoms: Secondary | ICD-10-CM

## 2017-07-12 DIAGNOSIS — D696 Thrombocytopenia, unspecified: Secondary | ICD-10-CM

## 2017-07-13 ENCOUNTER — Other Ambulatory Visit (INDEPENDENT_AMBULATORY_CARE_PROVIDER_SITE_OTHER): Payer: PRIVATE HEALTH INSURANCE

## 2017-07-13 DIAGNOSIS — Z1159 Encounter for screening for other viral diseases: Secondary | ICD-10-CM

## 2017-07-13 DIAGNOSIS — E782 Mixed hyperlipidemia: Secondary | ICD-10-CM | POA: Diagnosis not present

## 2017-07-13 DIAGNOSIS — D696 Thrombocytopenia, unspecified: Secondary | ICD-10-CM | POA: Diagnosis not present

## 2017-07-13 DIAGNOSIS — N400 Enlarged prostate without lower urinary tract symptoms: Secondary | ICD-10-CM | POA: Diagnosis not present

## 2017-07-13 DIAGNOSIS — D751 Secondary polycythemia: Secondary | ICD-10-CM

## 2017-07-13 LAB — COMPREHENSIVE METABOLIC PANEL
ALBUMIN: 4.6 g/dL (ref 3.5–5.2)
ALT: 36 U/L (ref 0–53)
AST: 20 U/L (ref 0–37)
Alkaline Phosphatase: 85 U/L (ref 39–117)
BILIRUBIN TOTAL: 1.5 mg/dL — AB (ref 0.2–1.2)
BUN: 20 mg/dL (ref 6–23)
CALCIUM: 9.5 mg/dL (ref 8.4–10.5)
CHLORIDE: 103 meq/L (ref 96–112)
CO2: 29 mEq/L (ref 19–32)
Creatinine, Ser: 0.88 mg/dL (ref 0.40–1.50)
GFR: 92.54 mL/min (ref >60.00)
Glucose, Bld: 128 mg/dL — ABNORMAL HIGH (ref 70–99)
Potassium: 4.6 mEq/L (ref 3.5–5.1)
Sodium: 139 mEq/L (ref 135–145)
Total Protein: 6.5 g/dL (ref 6.0–8.3)

## 2017-07-13 LAB — CBC WITH DIFFERENTIAL/PLATELET
BASOS ABS: 0 10*3/uL (ref 0.0–0.1)
BASOS PCT: 0.5 % (ref 0.0–3.0)
EOS ABS: 0.1 10*3/uL (ref 0.0–0.7)
Eosinophils Relative: 1 % (ref 0.0–5.0)
HEMATOCRIT: 48.2 % (ref 39.0–52.0)
Hemoglobin: 17 g/dL (ref 13.0–17.0)
LYMPHS PCT: 18.8 % (ref 12.0–46.0)
Lymphs Abs: 0.9 10*3/uL (ref 0.7–4.0)
MCHC: 35.2 g/dL (ref 30.0–36.0)
MCV: 93 fl (ref 78.0–100.0)
Monocytes Absolute: 0.4 10*3/uL (ref 0.1–1.0)
Monocytes Relative: 8.3 % (ref 3.0–12.0)
Neutro Abs: 3.5 10*3/uL (ref 1.4–7.7)
Neutrophils Relative %: 71.4 % (ref 43.0–77.0)
PLATELETS: 129 10*3/uL — AB (ref 150.0–400.0)
RBC: 5.18 Mil/uL (ref 4.22–5.81)
RDW: 13.2 % (ref 11.5–15.5)
WBC: 4.9 10*3/uL (ref 4.0–10.5)

## 2017-07-13 LAB — LIPID PANEL
CHOLESTEROL: 139 mg/dL (ref 0–200)
HDL: 50 mg/dL (ref >39.00)
LDL CALC: 72 mg/dL (ref 0–99)
NonHDL: 89.08
TRIGLYCERIDES: 86 mg/dL (ref 0.0–149.0)
Total CHOL/HDL Ratio: 3
VLDL: 17.2 mg/dL (ref 0.0–40.0)

## 2017-07-13 LAB — PSA: PSA: 0.5 ng/mL (ref 0.10–4.00)

## 2017-07-14 LAB — HEPATITIS C ANTIBODY
Hepatitis C Ab: NONREACTIVE
SIGNAL TO CUT-OFF: 0.02 (ref <1.00)

## 2017-07-20 ENCOUNTER — Ambulatory Visit (INDEPENDENT_AMBULATORY_CARE_PROVIDER_SITE_OTHER): Payer: PRIVATE HEALTH INSURANCE | Admitting: Family Medicine

## 2017-07-20 ENCOUNTER — Encounter: Payer: Self-pay | Admitting: Family Medicine

## 2017-07-20 VITALS — BP 140/82 | HR 72 | Temp 97.6°F | Ht 68.0 in | Wt 183.8 lb

## 2017-07-20 DIAGNOSIS — R739 Hyperglycemia, unspecified: Secondary | ICD-10-CM | POA: Diagnosis not present

## 2017-07-20 DIAGNOSIS — I10 Essential (primary) hypertension: Secondary | ICD-10-CM

## 2017-07-20 DIAGNOSIS — Z6379 Other stressful life events affecting family and household: Secondary | ICD-10-CM

## 2017-07-20 DIAGNOSIS — Z1211 Encounter for screening for malignant neoplasm of colon: Secondary | ICD-10-CM | POA: Diagnosis not present

## 2017-07-20 DIAGNOSIS — E782 Mixed hyperlipidemia: Secondary | ICD-10-CM

## 2017-07-20 DIAGNOSIS — Z0000 Encounter for general adult medical examination without abnormal findings: Secondary | ICD-10-CM

## 2017-07-20 DIAGNOSIS — D696 Thrombocytopenia, unspecified: Secondary | ICD-10-CM

## 2017-07-20 DIAGNOSIS — I2510 Atherosclerotic heart disease of native coronary artery without angina pectoris: Secondary | ICD-10-CM

## 2017-07-20 MED ORDER — METOPROLOL TARTRATE 25 MG PO TABS: 25.0000 mg | ORAL_TABLET | Freq: Two times a day (BID) | ORAL | 3 refills | Status: DC

## 2017-07-20 MED ORDER — NITROGLYCERIN 0.4 MG SL SUBL: 0.4000 mg | SUBLINGUAL_TABLET | SUBLINGUAL | 1 refills | Status: DC | PRN

## 2017-07-20 MED ORDER — AMLODIPINE BESYLATE 5 MG PO TABS: 5.0000 mg | ORAL_TABLET | Freq: Every day | ORAL | 3 refills | Status: DC

## 2017-07-20 MED ORDER — ZOSTER VAC RECOMB ADJUVANTED 50 MCG/0.5ML IM SUSR: 0.5000 mL | Freq: Once | INTRAMUSCULAR | 1 refills | Status: AC

## 2017-07-20 MED ORDER — LISINOPRIL 40 MG PO TABS: 40.0000 mg | ORAL_TABLET | Freq: Every day | ORAL | 3 refills | Status: DC

## 2017-07-20 MED ORDER — ROSUVASTATIN CALCIUM 20 MG PO TABS: 20.0000 mg | ORAL_TABLET | Freq: Every day | ORAL | 3 refills | Status: DC

## 2017-07-20 NOTE — Assessment & Plan Note (Addendum)
Chronic, stable. Continue crestor.  

## 2017-07-20 NOTE — Assessment & Plan Note (Signed)
Chronic - consider periph smear next labs.

## 2017-07-20 NOTE — Progress Notes (Signed)
BP 140/82 (BP Location: Left Arm, Patient Position: Sitting, Cuff Size: Normal)   Pulse 72   Temp 97.6 F (36.4 C) (Oral)   Ht _0  (1.727 m)   Wt 183 lb 12 oz (83.3 kg)   SpO2 97%   BMI 27.94 kg/m    CC: CPE Subjective:    Patient ID: Joseph Shea, male    DOB: Mar 30, 1951, 66 y.o.   MRN: [id number]  HPI: Joseph Shea is a 66 y.o. male presenting on 07/20/2017 for Annual Exam   51yo mother passed away 12/16/2016 from ovarian cancer. Son married and moved away. Wife just got out of rehab.  Last seen here 09/2015. [facility] for DOT physicals.  Ran out of meds 6-7 months ago. Restarted last month.   Preventative: Colon cancer screening - would like stool kit this year, considering colonoscopy next year Prostate cancer screening - increased nocturia 2-3x. Wants continued testing.  Declines flu  Tdap 2017 shingrix - discussed Seat belt use discussed Sunscreen use discussed. No changing moles on skin Non smoker -wife smokes outside  Alcohol - none   Lives with wife.  Occupation: Truck Geophysicist/field seismologist, [employer] Activity: rides bike 2-3 x/wk a few miles, tries to walk Diet: fruits/vegetables daily, small amt water  Relevant past medical, surgical, family and social history reviewed and updated as indicated. Interim medical history since our last visit reviewed. Allergies and medications reviewed and updated. Outpatient Medications Prior to Visit  Medication Sig Dispense Refill  . aspirin 81 MG tablet Take 162 mg by mouth daily.     . Multiple Vitamin (MULTIVITAMIN PO) Take 1 tablet by mouth daily.      Marland Kitchen amLODipine (NORVASC) 5 MG tablet Take 1 tablet (5 mg total) by mouth daily. 90 tablet 0  . lisinopril (PRINIVIL,ZESTRIL) 40 MG tablet TAKE 1 TABLET BY MOUTH ONCE DAILY 90 tablet 3  . metoprolol tartrate (LOPRESSOR) 25 MG tablet Take 1 tablet (25 mg total) by mouth 2 (two) times daily. 180 tablet 0  . nitroGLYCERIN (NITROSTAT) 0.4 MG SL tablet Place 0.4 mg  under the tongue every 5 (five) minutes as needed. Reported on 10/16/2015    . rosuvastatin (CRESTOR) 20 MG tablet Take 1 tablet (20 mg total) by mouth daily. 90 tablet 0   No facility-administered medications prior to visit.      Per HPI unless specifically indicated in ROS section below Review of Systems  Constitutional: Negative for activity change, appetite change, chills, fatigue, fever and unexpected weight change.  HENT: Negative for hearing loss.   Eyes: Negative for visual disturbance.  Respiratory: Negative for cough, chest tightness, shortness of breath and wheezing.   Cardiovascular: Negative for chest pain, palpitations and leg swelling.  Gastrointestinal: Negative for abdominal distention, abdominal pain, blood in stool, constipation, diarrhea, nausea and vomiting.  Genitourinary: Negative for difficulty urinating and hematuria.  Musculoskeletal: Negative for arthralgias, myalgias and neck pain.  Skin: Negative for rash.  Neurological: Negative for dizziness, seizures, syncope and headaches.  Hematological: Negative for adenopathy. Does not bruise/bleed easily.  Psychiatric/Behavioral: Negative for dysphoric mood. The patient is not nervous/anxious.        Objective:    BP 140/82 (BP Location: Left Arm, Patient Position: Sitting, Cuff Size: Normal)   Pulse 72   Temp 97.6 F (36.4 C) (Oral)   Ht _1  (1.727 m)   Wt 183 lb 12 oz (83.3 kg)   SpO2 97%   BMI 27.94 kg/m   Wt  Readings from Last 3 Encounters:  07/20/17 183 lb 12 oz (83.3 kg)  10/17/16 189 lb 8 oz (86 kg)  10/16/15 184 lb 8 oz (83.7 kg)    Physical Exam  Constitutional: He is oriented to person, place, and time. He appears well-developed and well-nourished. No distress.  HENT:  Head: Normocephalic and atraumatic.  Right Ear: Hearing, tympanic membrane, external ear and ear canal normal.  Left Ear: Hearing, tympanic membrane, external ear and ear canal normal.  Nose: Nose normal.  Mouth/Throat: Uvula  is midline, oropharynx is clear and moist and mucous membranes are normal. No oropharyngeal exudate, posterior oropharyngeal edema or posterior oropharyngeal erythema.  Eyes: Pupils are equal, round, and reactive to light. Conjunctivae and EOM are normal. No scleral icterus.  Neck: Normal range of motion. Neck supple. Carotid bruit is not present. No thyromegaly present.  Cardiovascular: Normal rate, regular rhythm, normal heart sounds and intact distal pulses.   No murmur heard. Pulses:      Radial pulses are 2+ on the right side, and 2+ on the left side.  Pulmonary/Chest: Effort normal and breath sounds normal. No respiratory distress. He has no wheezes. He has no rales.  Abdominal: Soft. Bowel sounds are normal. He exhibits no distension and no mass. There is no tenderness. There is no rebound and no guarding.  Genitourinary: Prostate normal. Rectal exam shows external hemorrhoid (non inflamed). Rectal exam shows no fissure, no mass, no tenderness and anal tone normal. Prostate is not enlarged (15gm) and not tender.  Musculoskeletal: Normal range of motion. He exhibits no edema.  Lymphadenopathy:    He has no cervical adenopathy.  Neurological: He is alert and oriented to person, place, and time.  CN grossly intact, station and gait intact  Skin: Skin is warm and dry. No rash noted.  Psychiatric: He has a normal mood and affect. His behavior is normal. Judgment and thought content normal.  Nursing note and vitals reviewed.  Results for orders placed or performed in visit on 07/13/17  Lipid panel  Result Value Ref Range   Cholesterol 139 0 - 200 mg/dL   Triglycerides 86.0 0.0 - 149.0 mg/dL   HDL 50.00 >39.00 mg/dL   VLDL 17.2 0.0 - 40.0 mg/dL   LDL Cholesterol 72 0 - 99 mg/dL   Total CHOL/HDL Ratio 3    NonHDL 89.08   Comprehensive metabolic panel  Result Value Ref Range   Sodium 139 135 - 145 mEq/L   Potassium 4.6 3.5 - 5.1 mEq/L   Chloride 103 96 - 112 mEq/L   CO2 29 19 - 32  mEq/L   Glucose, Bld 128 (H) 70 - 99 mg/dL   BUN 20 6 - 23 mg/dL   Creatinine, Ser 0.88 0.40 - 1.50 mg/dL   Total Bilirubin 1.5 (H) 0.2 - 1.2 mg/dL   Alkaline Phosphatase 85 39 - 117 U/L   AST 20 0 - 37 U/L   ALT 36 0 - 53 U/L   Total Protein 6.5 6.0 - 8.3 g/dL   Albumin 4.6 3.5 - 5.2 g/dL   Calcium 9.5 8.4 - 10.5 mg/dL   GFR 92.54 >60.00 mL/min  CBC with Differential/Platelet  Result Value Ref Range   WBC 4.9 4.0 - 10.5 K/uL   RBC 5.18 4.22 - 5.81 Mil/uL   Hemoglobin 17.0 13.0 - 17.0 g/dL   HCT 48.2 39.0 - 52.0 %   MCV 93.0 78.0 - 100.0 fl   MCHC 35.2 30.0 - 36.0 g/dL   RDW 13.2  11.5 - 15.5 %   Platelets 129.0 (L) 150.0 - 400.0 K/uL   Neutrophils Relative % 71.4 43.0 - 77.0 %   Lymphocytes Relative 18.8 12.0 - 46.0 %   Monocytes Relative 8.3 3.0 - 12.0 %   Eosinophils Relative 1.0 0.0 - 5.0 %   Basophils Relative 0.5 0.0 - 3.0 %   Neutro Abs 3.5 1.4 - 7.7 K/uL   Lymphs Abs 0.9 0.7 - 4.0 K/uL   Monocytes Absolute 0.4 0.1 - 1.0 K/uL   Eosinophils Absolute 0.1 0.0 - 0.7 K/uL   Basophils Absolute 0.0 0.0 - 0.1 K/uL  PSA  Result Value Ref Range   PSA 0.50 0.10 - 4.00 ng/mL  Hepatitis C antibody  Result Value Ref Range   Hepatitis C Ab NON-REACTIVE NON-REACTI   SIGNAL TO CUT-OFF 0.02 <1.00      Assessment & Plan:   Problem List Items Addressed This Visit    Coronary atherosclerosis    Chronic, stable encouraged f/u with cards.       Relevant Medications   amLODipine (NORVASC) 5 MG tablet   lisinopril (PRINIVIL,ZESTRIL) 40 MG tablet   metoprolol tartrate (LOPRESSOR) 25 MG tablet   nitroGLYCERIN (NITROSTAT) 0.4 MG SL tablet   rosuvastatin (CRESTOR) 20 MG tablet   Healthcare maintenance - Primary    Preventative protocols reviewed and updated unless pt declined. Discussed healthy diet and lifestyle.       HLD (hyperlipidemia)    Chronic, stable. Continue crestor.       Relevant Medications   amLODipine (NORVASC) 5 MG tablet   lisinopril (PRINIVIL,ZESTRIL) 40  MG tablet   metoprolol tartrate (LOPRESSOR) 25 MG tablet   nitroGLYCERIN (NITROSTAT) 0.4 MG SL tablet   rosuvastatin (CRESTOR) 20 MG tablet   Hyperglycemia    Sugars today in diabetes range. Reviewed with patient. Again encouraged he decrease ice cream and work on low sugar low carb diet.      HYPERTENSION, BENIGN ESSENTIAL    Chronic, stable. Continue current regimen. He is not regular with amlodipine - states he feels too tired when taken regularly      Relevant Medications   amLODipine (NORVASC) 5 MG tablet   lisinopril (PRINIVIL,ZESTRIL) 40 MG tablet   metoprolol tartrate (LOPRESSOR) 25 MG tablet   nitroGLYCERIN (NITROSTAT) 0.4 MG SL tablet   rosuvastatin (CRESTOR) 20 MG tablet   Stressful life event affecting family    Stressful year - support provided.       Thrombocytopenia ([place])    Chronic - consider periph smear next labs.        Other Visit Diagnoses    Special screening for malignant neoplasms, colon       Relevant Orders   Fecal occult blood, imunochemical       Follow up plan: Return in about 6 months (around 01/18/2018) for follow up visit.  Ria Bush, MD

## 2017-07-20 NOTE — Assessment & Plan Note (Signed)
Preventative protocols reviewed and updated unless pt declined. Discussed healthy diet and lifestyle.  

## 2017-07-20 NOTE — Assessment & Plan Note (Signed)
Chronic, stable encouraged f/u with cards.

## 2017-07-20 NOTE — Assessment & Plan Note (Signed)
Stressful year - support provided.

## 2017-07-20 NOTE — Assessment & Plan Note (Signed)
Sugars today in diabetes range. Reviewed with patient. Again encouraged he decrease ice cream and work on low sugar low carb diet.

## 2017-07-20 NOTE — Assessment & Plan Note (Signed)
Chronic, stable. Continue current regimen. He is not regular with amlodipine - states he feels too tired when taken regularly

## 2017-07-20 NOTE — Patient Instructions (Addendum)
Follow up with Dr Rockey Situ on next appt due date. Pass by lab to pick up stool kit.  If interested, check with pharmacy about new 2 shot shingles series (shingrix). Rx provided today.  Return in 6 months for follow up visit with labs - work on low sugar diet to help control sugar levels in blood.   Health Maintenance, Male A healthy lifestyle and preventive care is important for your health and wellness. Ask your health care provider about what schedule of regular examinations is right for you. What should I know about weight and diet? Eat a Healthy Diet  Eat plenty of vegetables, fruits, whole grains, low-fat dairy products, and lean protein.  Do not eat a lot of foods high in solid fats, added sugars, or salt.  Maintain a Healthy Weight Regular exercise can help you achieve or maintain a healthy weight. You should:  Do at least 150 minutes of exercise each week. The exercise should increase your heart rate and make you sweat (moderate-intensity exercise).  Do strength-training exercises at least twice a week.  Watch Your Levels of Cholesterol and Blood Lipids  Have your blood tested for lipids and cholesterol every 5 years starting at 64 years of age. If you are at high risk for heart disease, you should start having your blood tested when you are 64 years old. You may need to have your cholesterol levels checked more often if: ? Your lipid or cholesterol levels are high. ? You are older than 64 years of age. ? You are at high risk for heart disease.  What should I know about cancer screening? Many types of cancers can be detected early and may often be prevented. Lung Cancer  You should be screened every year for lung cancer if: ? You are a current smoker who has smoked for at least 30 years. ? You are a former smoker who has quit within the past 15 years.  Talk to your health care provider about your screening options, when you should start screening, and how often you should be  screened.  Colorectal Cancer  Routine colorectal cancer screening usually begins at 64 years of age and should be repeated every 5-10 years until you are 64 years old. You may need to be screened more often if early forms of precancerous polyps or small growths are found. Your health care provider may recommend screening at an earlier age if you have risk factors for colon cancer.  Your health care provider may recommend using home test kits to check for hidden blood in the stool.  A small camera at the end of a tube can be used to examine your colon (sigmoidoscopy or colonoscopy). This checks for the earliest forms of colorectal cancer.  Prostate and Testicular Cancer  Depending on your age and overall health, your health care provider may do certain tests to screen for prostate and testicular cancer.  Talk to your health care provider about any symptoms or concerns you have about testicular or prostate cancer.  Skin Cancer  Check your skin from head to toe regularly.  Tell your health care provider about any new moles or changes in moles, especially if: ? There is a change in a mole's size, shape, or color. ? You have a mole that is larger than a pencil eraser.  Always use sunscreen. Apply sunscreen liberally and repeat throughout the day.  Protect yourself by wearing long sleeves, pants, a wide-brimmed hat, and sunglasses when outside.  What should I  know about heart disease, diabetes, and high blood pressure?  If you are 49-10 years of age, have your blood pressure checked every 3-5 years. If you are 53 years of age or older, have your blood pressure checked every year. You should have your blood pressure measured twice-once when you are at a hospital or clinic, and once when you are not at a hospital or clinic. Record the average of the two measurements. To check your blood pressure when you are not at a hospital or clinic, you can use: ? An automated blood pressure machine at a  pharmacy. ? A home blood pressure monitor.  Talk to your health care provider about your target blood pressure.  If you are between 46-25 years old, ask your health care provider if you should take aspirin to prevent heart disease.  Have regular diabetes screenings by checking your fasting blood sugar level. ? If you are at a normal weight and have a low risk for diabetes, have this test once every three years after the age of 44. ? If you are overweight and have a high risk for diabetes, consider being tested at a younger age or more often.  A one-time screening for abdominal aortic aneurysm (AAA) by ultrasound is recommended for men aged 46-75 years who are current or former smokers. What should I know about preventing infection? Hepatitis B If you have a higher risk for hepatitis B, you should be screened for this virus. Talk with your health care provider to find out if you are at risk for hepatitis B infection. Hepatitis C Blood testing is recommended for:  Everyone born from [birth year] through 1963.  Anyone with known risk factors for hepatitis C.  Sexually Transmitted Diseases (STDs)  You should be screened each year for STDs including gonorrhea and chlamydia if: ? You are sexually active and are younger than 64 years of age. ? You are older than 64 years of age and your health care provider tells you that you are at risk for this type of infection. ? Your sexual activity has changed since you were last screened and you are at an increased risk for chlamydia or gonorrhea. Ask your health care provider if you are at risk.  Talk with your health care provider about whether you are at high risk of being infected with HIV. Your health care provider may recommend a prescription medicine to help prevent HIV infection.  What else can I do?  Schedule regular health, dental, and eye exams.  Stay current with your vaccines (immunizations).  Do not use any tobacco products, such as  cigarettes, chewing tobacco, and e-cigarettes. If you need help quitting, ask your health care provider.  Limit alcohol intake to no more than 2 drinks per day. One drink equals 12 ounces of beer, 5 ounces of wine, or 1 ounces of hard liquor.  Do not use street drugs.  Do not share needles.  Ask your health care provider for help if you need support or information about quitting drugs.  Tell your health care provider if you often feel depressed.  Tell your health care provider if you have ever been abused or do not feel safe at home. This information is not intended to replace advice given to you by your health care provider. Make sure you discuss any questions you have with your health care provider. Document Released: 03/06/2008 Document Revised: 05/07/2016 Document Reviewed: 06/12/2015 Elsevier Interactive Patient Education  [employer].

## 2017-08-19 ENCOUNTER — Other Ambulatory Visit: Payer: PRIVATE HEALTH INSURANCE

## 2017-08-19 DIAGNOSIS — Z1211 Encounter for screening for malignant neoplasm of colon: Secondary | ICD-10-CM

## 2017-08-19 LAB — FECAL OCCULT BLOOD, GUAIAC: Fecal Occult Blood: NEGATIVE

## 2017-08-19 LAB — FECAL OCCULT BLOOD, IMMUNOCHEMICAL: Fecal Occult Bld: NEGATIVE

## 2017-08-20 ENCOUNTER — Encounter: Payer: Self-pay | Admitting: Family Medicine

## 2018-01-11 ENCOUNTER — Other Ambulatory Visit: Payer: PRIVATE HEALTH INSURANCE

## 2018-01-18 ENCOUNTER — Ambulatory Visit: Payer: PRIVATE HEALTH INSURANCE | Admitting: Family Medicine

## 2018-05-14 DIAGNOSIS — I25118 Atherosclerotic heart disease of native coronary artery with other forms of angina pectoris: Secondary | ICD-10-CM | POA: Insufficient documentation
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
## 2018-05-17 ENCOUNTER — Ambulatory Visit: Payer: Medicare HMO | Admitting: Cardiovascular Disease

## 2018-05-17 ENCOUNTER — Encounter: Payer: Self-pay | Admitting: Cardiovascular Disease

## 2018-05-17 VITALS — BP 136/78 | HR 66 | Ht 69.5 in | Wt 190.0 lb

## 2018-05-17 DIAGNOSIS — E782 Mixed hyperlipidemia: Secondary | ICD-10-CM | POA: Diagnosis not present

## 2018-05-17 DIAGNOSIS — I25118 Atherosclerotic heart disease of native coronary artery with other forms of angina pectoris: Secondary | ICD-10-CM | POA: Diagnosis not present

## 2018-05-17 DIAGNOSIS — I10 Essential (primary) hypertension: Secondary | ICD-10-CM

## 2018-05-17 MED ORDER — LISINOPRIL 40 MG PO TABS: 40.0000 mg | ORAL_TABLET | Freq: Every day | ORAL | 3 refills | Status: DC

## 2018-05-17 NOTE — Patient Instructions (Addendum)
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 For further information please visit [email or web address]. Please follow instruction sheet as given.   Do not drink or eat foods with caffeine for 24 hours before the test. (Chocolate, coffee, tea, or energy drinks)  If you use an inhaler, bring it with you to the test.  Do not smoke for 4 hours before the test.  Wear comfortable shoes and clothing.  Follow-Up: It was a pleasure seeing you in the office today. Please call us if you have new issues that need to be addressed before your next appt.  [phone]  Your physician wants you to follow-up in: 12 months.  You will receive a reminder letter in the mail two months in advance. If you don't receive a letter, please call our office to schedule the follow-up appointment.  If you need a refill on your cardiac medications before your next appointment, please call your pharmacy.  For educational health videos Log in to : [email or web address] Or : [email or web address], password : [email or web address]    Exercise Stress Echocardiogram An exercise stress echocardiogram is a test that checks how well your heart is working. For this test, you will walk on a treadmill to make your heart beat faster. This test uses sound waves (ultrasound) and a computer to make pictures (images) of your heart. These pictures will be taken before you exercise and after you exercise. What happens before the procedure?  Follow instructions from your doctor about what you cannot eat or drink before the test.  Do not drink or eat anything that has caffeine in it. Stop having caffeine for 24 hours before the test.  Ask your doctor about changing or stopping your normal medicines. This is important if you take diabetes medicines or blood thinners. Ask your  doctor if you should take your medicines with water before the test.  If you use an inhaler, bring it to the test.  Do not use any products that have nicotine or tobacco in them, such as cigarettes and e-cigarettes. Stop using them for 4 hours before the test. If you need help quitting, ask your doctor.  Wear comfortable shoes and clothing. What happens during the procedure?  You will be hooked up to a TV screen. Your doctor will watch the screen to see how fast your heart beats during the test.  Before you exercise, a computer will make a picture of your heart. To do this: ? A gel will be put on your chest. ? A wand will be moved over the gel. ? Sound waves from the wand will go to the computer to make the picture.  Your will start walking on a treadmill. The treadmill will start at a slow speed. It will get faster a little bit at a time. When you walk faster, your heart will beat faster.  The treadmill will be stopped when your heart is working hard.  You will lie down right away so another picture of your heart can be taken.  The test will take 30-60 minutes. What happens after the procedure?  Your heart rate and blood pressure will be watched after the test.  If your doctor says that you can, you may: ? Eat what you usually eat. ? Do your normal activities. ? Take medicines like normal. Summary  An exercise stress  echocardiogram is a test that checks how well your heart is working.  Follow instructions about what you cannot eat or drink before the test. Ask your doctor if you should take your normal medicines before the test.  Stop having caffeine for 24 hours before the test. Do not use anything with nicotine or tobacco in it for 4 hours before the test.  A computer will take a picture of your heart before you walk on a treadmill. It will take another picture when you are done walking.  Your heart rate and blood pressure will be watched after the test. This information  is not intended to replace advice given to you by your health care provider. Make sure you discuss any questions you have with your health care provider. Document Released: 07/06/2009 Document Revised: 06/01/2016 Document Reviewed: 06/01/2016 Elsevier Interactive Patient Education  2017 [employer].

## 2018-06-01 ENCOUNTER — Ambulatory Visit (INDEPENDENT_AMBULATORY_CARE_PROVIDER_SITE_OTHER): Payer: Medicare HMO

## 2018-06-01 DIAGNOSIS — I25118 Atherosclerotic heart disease of native coronary artery with other forms of angina pectoris: Secondary | ICD-10-CM

## 2018-06-01 LAB — ECHOCARDIOGRAM STRESS TEST
CHL CUP MPHR: 155 {beats}/min
CHL CUP RESTING HR STRESS: 76 {beats}/min
CSEPEDS: 56 s
CSEPPHR: 150 {beats}/min
Estimated workload: 10.1 METS
Exercise duration (min): 8 min
Percent HR: 96 %
RPE: 15

## 2018-06-07 ENCOUNTER — Telehealth: Payer: Self-pay | Admitting: Cardiovascular Disease

## 2018-06-07 NOTE — Telephone Encounter (Signed)
Pt would like echo stress test results.

## 2018-06-07 NOTE — Telephone Encounter (Signed)
Left voicemail message to call back  

## 2018-06-07 NOTE — Telephone Encounter (Signed)
Patient calling to discuss recent testing results and needs a written copy   Please call

## 2018-06-08 ENCOUNTER — Encounter: Payer: Self-pay | Admitting: *Deleted

## 2018-06-08 NOTE — Telephone Encounter (Signed)
Spoke with patient and reviewed results with him. Let him know that I would have letter signed by provider and place it up front for him to pick up tomorrow. He verbalized understanding with no further questions at this time.

## 2018-06-10 NOTE — Telephone Encounter (Signed)
Letter given to Sabrina to place up front. Letter missing and patient is here to pick up. Reprinted letter and given to Doreatha LewAshley G for her to give patient.

## 2018-07-12 ENCOUNTER — Other Ambulatory Visit: Payer: Self-pay | Admitting: Family Medicine

## 2018-10-10 ENCOUNTER — Telehealth: Payer: Self-pay | Admitting: Family Medicine

## 2018-10-11 NOTE — Telephone Encounter (Signed)
Last office visit 07/20/2017.  Last refilled 07/12/2018 for #90 with no refill.  Note to pharmacy states needs annual wellness appointment for additional refills.  No future appointments.  Refill?

## 2018-10-13 NOTE — Telephone Encounter (Signed)
Please call and schedule follow-up as instructed.

## 2018-10-13 NOTE — Telephone Encounter (Signed)
Refilled. plz call and offer f/u appt overdue

## 2018-10-14 NOTE — Telephone Encounter (Signed)
Left message asking pt to call office  °

## 2018-10-18 NOTE — Telephone Encounter (Signed)
Appointment 2/3

## 2018-10-18 NOTE — Telephone Encounter (Signed)
Noted  

## 2018-10-25 ENCOUNTER — Encounter: Payer: Self-pay | Admitting: Family Medicine

## 2018-10-25 ENCOUNTER — Ambulatory Visit (INDEPENDENT_AMBULATORY_CARE_PROVIDER_SITE_OTHER): Payer: Medicare HMO | Admitting: Family Medicine

## 2018-10-25 VITALS — BP 136/82 | HR 64 | Temp 97.8°F | Ht 68.0 in | Wt 189.5 lb

## 2018-10-25 DIAGNOSIS — I2510 Atherosclerotic heart disease of native coronary artery without angina pectoris: Secondary | ICD-10-CM

## 2018-10-25 DIAGNOSIS — I10 Essential (primary) hypertension: Secondary | ICD-10-CM

## 2018-10-25 DIAGNOSIS — R739 Hyperglycemia, unspecified: Secondary | ICD-10-CM

## 2018-10-25 DIAGNOSIS — E782 Mixed hyperlipidemia: Secondary | ICD-10-CM | POA: Diagnosis not present

## 2018-10-25 DIAGNOSIS — N400 Enlarged prostate without lower urinary tract symptoms: Secondary | ICD-10-CM | POA: Diagnosis not present

## 2018-10-25 DIAGNOSIS — Z23 Encounter for immunization: Secondary | ICD-10-CM

## 2018-10-25 DIAGNOSIS — D696 Thrombocytopenia, unspecified: Secondary | ICD-10-CM

## 2018-10-25 LAB — CBC WITH DIFFERENTIAL/PLATELET
Basophils Absolute: 0 10*3/uL (ref 0.0–0.1)
Basophils Relative: 0.4 % (ref 0.0–3.0)
EOS PCT: 0.3 % (ref 0.0–5.0)
Eosinophils Absolute: 0 10*3/uL (ref 0.0–0.7)
HCT: 48.5 % (ref 39.0–52.0)
Hemoglobin: 17.1 g/dL — ABNORMAL HIGH (ref 13.0–17.0)
Lymphocytes Relative: 15.7 % (ref 12.0–46.0)
Lymphs Abs: 1.1 10*3/uL (ref 0.7–4.0)
MCHC: 35.2 g/dL (ref 30.0–36.0)
MCV: 91.5 fl (ref 78.0–100.0)
Monocytes Absolute: 0.5 10*3/uL (ref 0.1–1.0)
Monocytes Relative: 7.3 % (ref 3.0–12.0)
NEUTROS ABS: 5.3 10*3/uL (ref 1.4–7.7)
Neutrophils Relative %: 76.3 % (ref 43.0–77.0)
Platelets: 128 10*3/uL — ABNORMAL LOW (ref 150.0–400.0)
RBC: 5.29 Mil/uL (ref 4.22–5.81)
RDW: 13.6 % (ref 11.5–15.5)
WBC: 6.9 10*3/uL (ref 4.0–10.5)

## 2018-10-25 LAB — COMPREHENSIVE METABOLIC PANEL
ALT: 43 U/L (ref 0–53)
AST: 17 U/L (ref 0–37)
Albumin: 4.8 g/dL (ref 3.5–5.2)
Alkaline Phosphatase: 75 U/L (ref 39–117)
BUN: 17 mg/dL (ref 6–23)
CO2: 29 meq/L (ref 19–32)
Calcium: 10.1 mg/dL (ref 8.4–10.5)
Chloride: 101 mEq/L (ref 96–112)
Creatinine, Ser: 0.92 mg/dL (ref 0.40–1.50)
GFR: 82.39 mL/min (ref >60.00)
GLUCOSE: 162 mg/dL — AB (ref 70–99)
Potassium: 4.8 mEq/L (ref 3.5–5.1)
Sodium: 138 mEq/L (ref 135–145)
Total Bilirubin: 1.5 mg/dL — ABNORMAL HIGH (ref 0.2–1.2)
Total Protein: 6.7 g/dL (ref 6.0–8.3)

## 2018-10-25 LAB — HEMOGLOBIN A1C: HEMOGLOBIN A1C: 5.9 % (ref 4.6–6.5)

## 2018-10-25 LAB — LIPID PANEL
CHOLESTEROL: 132 mg/dL (ref 0–200)
HDL: 37.1 mg/dL — ABNORMAL LOW (ref >39.00)
LDL CALC: 57 mg/dL (ref 0–99)
NonHDL: 94.56
TRIGLYCERIDES: 186 mg/dL — AB (ref 0.0–149.0)
Total CHOL/HDL Ratio: 4
VLDL: 37.2 mg/dL (ref 0.0–40.0)

## 2018-10-25 LAB — PSA: PSA: 0.45 ng/mL (ref 0.10–4.00)

## 2018-10-25 MED ORDER — ROSUVASTATIN CALCIUM 20 MG PO TABS: 20.0000 mg | ORAL_TABLET | Freq: Every day | ORAL | 3 refills | Status: DC

## 2018-10-25 MED ORDER — METOPROLOL TARTRATE 25 MG PO TABS: 25.0000 mg | ORAL_TABLET | Freq: Two times a day (BID) | ORAL | 3 refills | Status: DC

## 2018-10-25 MED ORDER — LISINOPRIL 40 MG PO TABS: 40.0000 mg | ORAL_TABLET | Freq: Every day | ORAL | 3 refills | Status: DC

## 2018-10-25 NOTE — Assessment & Plan Note (Addendum)
?  aspirin related. Update CBC. Consider periph smear

## 2018-10-25 NOTE — Assessment & Plan Note (Signed)
Chronic, stable. Continue crestor. The 10-year ASCVD risk score Denman George(Goff DC Montez HagemanJr., et al., 2013) is: 12.8%   Values used to calculate the score:     Age: [age] years     Sex: Male     Is Non-Hispanic African American: No     Diabetic: No     Tobacco smoker: No     Systolic Blood Pressure: 136 mmHg     Is BP treated: Yes     HDL Cholesterol: 50 mg/dL     Total Cholesterol: 139 mg/dL

## 2018-10-25 NOTE — Progress Notes (Signed)
BP 136/82 (BP Location: Left Arm, Patient Position: Sitting, Cuff Size: Normal)   Pulse 64   Temp 97.8 F (36.6 C) (Oral)   Ht 5\' 8"  (1.727 m)   Wt 189 lb 8 oz (86 kg)   SpO2 98%   BMI 28.81 kg/m    CC: med refill visit Subjective:    Patient ID: Joseph Shea, male    DOB: 1951-01-16, 68 y.o.   MRN: [id number]  HPI: Joseph Shea is a 68 y.o. 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 Lost insurance, just got medicare. 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 .  Wife continues drinking/smoking.  Last seen 06/2017. Asking for physical although scheduled 15 min slot.   CAD followed by cardiology. Had normal echo stress test, EF 60% (05/2018).  HTN - on amlodipine, lisinopril, metoprolol. Feels amlodipine causes fatigue. No HA, vision changes, CP/tightness, SOB, leg swelling.  HLD on crestor, tolerating well without significant myalgias.  Hyperglycemia - due for retesting. Tries to limit sugar in diet.  Lab Results  Component Value Date   HGBA1C 4.8 06/21/2012       Relevant past medical, surgical, family and social history reviewed and updated as indicated. Interim medical history since our last visit reviewed. Allergies and medications reviewed and updated. Outpatient Medications Prior to Visit  Medication Sig Dispense Refill  . aspirin 81 MG tablet Take 1 tablet (81 mg total) by mouth daily. 30 tablet   . Multiple Vitamin (MULTIVITAMIN PO) Take 1 tablet by mouth daily.      . nitroGLYCERIN (NITROSTAT) 0.4 MG SL tablet Place 1 tablet (0.4 mg total) under the tongue every 5 (five) minutes as needed. Reported on 10/16/2015 30 tablet 1  . lisinopril (PRINIVIL,ZESTRIL) 40 MG tablet Take 1 tablet (40 mg total) by mouth daily. 90 tablet 3  . metoprolol tartrate (LOPRESSOR) 25 MG tablet Take 1 tablet (25 mg total) by mouth 2 (two) times daily. 180 tablet 3  . rosuvastatin  (CRESTOR) 20 MG tablet TAKE 1 TABLET BY MOUTH EVERY DAY 90 tablet 0  . amLODipine (NORVASC) 5 MG tablet Take 1 tablet (5 mg total) by mouth daily. 90 tablet 3   No facility-administered medications prior to visit.      Per HPI unless specifically indicated in ROS section below Review of Systems Objective:    BP 136/82 (BP Location: Left Arm, Patient Position: Sitting, Cuff Size: Normal)   Pulse 64   Temp 97.8 F (36.6 C) (Oral)   Ht 5\' 8"  (1.727 m)   Wt 189 lb 8 oz (86 kg)   SpO2 98%   BMI 28.81 kg/m   Wt Readings from Last 3 Encounters:  10/25/18 189 lb 8 oz (86 kg)  05/17/18 190 lb (86.2 kg)  07/20/17 183 lb 12 oz (83.3 kg)    Physical Exam Vitals signs and nursing note reviewed.  Constitutional:      Appearance: Normal appearance. He is not ill-appearing.  HENT:     Head: Normocephalic and atraumatic.     Nose: Nose normal.     Mouth/Throat:     Mouth: Mucous membranes are moist.  Eyes:     Extraocular Movements: Extraocular movements intact.     Pupils: Pupils are equal, round, and reactive to light.  Cardiovascular:     Rate and Rhythm: Normal rate and regular rhythm.     Pulses: Normal  pulses.     Heart sounds: Normal heart sounds. No murmur.  Pulmonary:     Effort: Pulmonary effort is normal. No respiratory distress.     Breath sounds: Normal breath sounds. No wheezing, rhonchi or rales.  Musculoskeletal:     Right lower leg: No edema.     Left lower leg: No edema.  Neurological:     Mental Status: He is alert.  Psychiatric:        Mood and Affect: Mood normal.       Results for orders placed or performed in visit on 06/01/18  ECHOCARDIOGRAM STRESS TEST  Result Value Ref Range   Rest HR 76 bpm   Rest BP 152/98 mmHg   RPE 15    Exercise duration (sec) 56 sec   Percent HR 96 %   Exercise duration (min) 8 min   Estimated workload 10.1 METS   Peak HR 150 bpm   Peak BP 215/87 mmHg   MPHR 155 bpm   Assessment & Plan:   Problem List Items Addressed  This Visit    Thrombocytopenia (HCC)    ?aspirin related. Update CBC. Consider periph smear      Relevant Orders   CBC with Differential/Platelet   Mixed hyperlipidemia    Chronic, stable. Continue crestor. The 10-year ASCVD risk score Denman George DC Montez Hageman., et al., 2013) is: 12.8%   Values used to calculate the score:     Age: 60 years     Sex: Male     Is Non-Hispanic African American: No     Diabetic: No     Tobacco smoker: No     Systolic Blood Pressure: 136 mmHg     Is BP treated: Yes     HDL Cholesterol: 50 mg/dL     Total Cholesterol: 139 mg/dL       Relevant Medications   rosuvastatin (CRESTOR) 20 MG tablet   lisinopril (PRINIVIL,ZESTRIL) 40 MG tablet   metoprolol tartrate (LOPRESSOR) 25 MG tablet   Other Relevant Orders   Lipid panel   Comprehensive metabolic panel   HYPERTENSION, BENIGN ESSENTIAL - Primary    Chronic, stable. Continue current regimen. Off amlodipine.       Relevant Medications   rosuvastatin (CRESTOR) 20 MG tablet   lisinopril (PRINIVIL,ZESTRIL) 40 MG tablet   metoprolol tartrate (LOPRESSOR) 25 MG tablet   Hyperglycemia    Update sugar levels, A1c.       Relevant Orders   Hemoglobin A1c   Coronary atherosclerosis   Relevant Medications   rosuvastatin (CRESTOR) 20 MG tablet   lisinopril (PRINIVIL,ZESTRIL) 40 MG tablet   metoprolol tartrate (LOPRESSOR) 25 MG tablet   Benign prostatic hyperplasia    Update PSA      Relevant Orders   PSA    Other Visit Diagnoses    Need for influenza vaccination       Relevant Orders   Flu Vaccine QUAD 36+ mos IM (Completed)       Meds ordered this encounter  Medications  . rosuvastatin (CRESTOR) 20 MG tablet    Sig: Take 1 tablet (20 mg total) by mouth daily.    Dispense:  90 tablet    Refill:  3  . lisinopril (PRINIVIL,ZESTRIL) 40 MG tablet    Sig: Take 1 tablet (40 mg total) by mouth daily.    Dispense:  90 tablet    Refill:  3  . metoprolol tartrate (LOPRESSOR) 25 MG tablet    Sig: Take 1  tablet (25 mg  total) by mouth 2 (two) times daily.    Dispense:  180 tablet    Refill:  3   Orders Placed This Encounter  Procedures  . Flu Vaccine QUAD 36+ mos IM  . Lipid panel  . Hemoglobin A1c  . Comprehensive metabolic panel  . PSA  . CBC with Differential/Platelet    Follow up plan: Return in about 2 months (around 12/24/2018).  Eustaquio BoydenJavier Kerron Sedano, MD

## 2018-10-25 NOTE — Assessment & Plan Note (Signed)
Update PSA 

## 2018-10-25 NOTE — Assessment & Plan Note (Signed)
Chronic, stable. Continue current regimen. Off amlodipine.

## 2018-10-25 NOTE — Patient Instructions (Addendum)
Flu shot today Labs today Return in 2 months for welcome to medicare visit.

## 2018-10-25 NOTE — Assessment & Plan Note (Signed)
Update sugar levels, A1c.

## 2018-12-27 ENCOUNTER — Telehealth: Payer: Self-pay | Admitting: Family Medicine

## 2018-12-27 NOTE — Telephone Encounter (Signed)
Left message asking pt to call office please r/s welcome to medicare appointment

## 2019-01-03 ENCOUNTER — Encounter: Payer: Medicare HMO | Admitting: Family Medicine

## 2019-04-25 ENCOUNTER — Ambulatory Visit (INDEPENDENT_AMBULATORY_CARE_PROVIDER_SITE_OTHER): Payer: Medicare HMO | Admitting: Family Medicine

## 2019-04-25 ENCOUNTER — Encounter: Payer: Self-pay | Admitting: Family Medicine

## 2019-04-25 ENCOUNTER — Other Ambulatory Visit: Payer: Self-pay

## 2019-04-25 VITALS — BP 134/82 | HR 60 | Temp 97.9°F | Ht 68.0 in | Wt 177.2 lb

## 2019-04-25 DIAGNOSIS — Z1211 Encounter for screening for malignant neoplasm of colon: Secondary | ICD-10-CM

## 2019-04-25 DIAGNOSIS — E782 Mixed hyperlipidemia: Secondary | ICD-10-CM

## 2019-04-25 DIAGNOSIS — D696 Thrombocytopenia, unspecified: Secondary | ICD-10-CM | POA: Diagnosis not present

## 2019-04-25 DIAGNOSIS — R739 Hyperglycemia, unspecified: Secondary | ICD-10-CM | POA: Diagnosis not present

## 2019-04-25 DIAGNOSIS — N400 Enlarged prostate without lower urinary tract symptoms: Secondary | ICD-10-CM

## 2019-04-25 DIAGNOSIS — Z23 Encounter for immunization: Secondary | ICD-10-CM | POA: Diagnosis not present

## 2019-04-25 DIAGNOSIS — Z0000 Encounter for general adult medical examination without abnormal findings: Secondary | ICD-10-CM | POA: Insufficient documentation

## 2019-04-25 DIAGNOSIS — I10 Essential (primary) hypertension: Secondary | ICD-10-CM

## 2019-04-25 DIAGNOSIS — Z7189 Other specified counseling: Secondary | ICD-10-CM | POA: Insufficient documentation

## 2019-04-25 LAB — CBC WITH DIFFERENTIAL/PLATELET
Basophils Absolute: 0 10*3/uL (ref 0.0–0.1)
Basophils Relative: 0.3 % (ref 0.0–3.0)
Eosinophils Absolute: 0 10*3/uL (ref 0.0–0.7)
Eosinophils Relative: 0.6 % (ref 0.0–5.0)
HCT: 47.6 % (ref 39.0–52.0)
Hemoglobin: 16.8 g/dL (ref 13.0–17.0)
Lymphocytes Relative: 16.7 % (ref 12.0–46.0)
Lymphs Abs: 1.3 10*3/uL (ref 0.7–4.0)
MCHC: 35.2 g/dL (ref 30.0–36.0)
MCV: 93 fl (ref 78.0–100.0)
Monocytes Absolute: 0.5 10*3/uL (ref 0.1–1.0)
Monocytes Relative: 7 % (ref 3.0–12.0)
Neutro Abs: 5.8 10*3/uL (ref 1.4–7.7)
Neutrophils Relative %: 75.4 % (ref 43.0–77.0)
Platelets: 125 10*3/uL — ABNORMAL LOW (ref 150.0–400.0)
RBC: 5.11 Mil/uL (ref 4.22–5.81)
RDW: 13.6 % (ref 11.5–15.5)
WBC: 7.7 10*3/uL (ref 4.0–10.5)

## 2019-04-25 LAB — BASIC METABOLIC PANEL
BUN: 20 mg/dL (ref 6–23)
CO2: 28 mEq/L (ref 19–32)
Calcium: 9.9 mg/dL (ref 8.4–10.5)
Chloride: 103 mEq/L (ref 96–112)
Creatinine, Ser: 0.93 mg/dL (ref 0.40–1.50)
GFR: 81.24 mL/min (ref >60.00)
Glucose, Bld: 129 mg/dL — ABNORMAL HIGH (ref 70–99)
Potassium: 4.9 mEq/L (ref 3.5–5.1)
Sodium: 138 mEq/L (ref 135–145)

## 2019-04-25 LAB — HEMOGLOBIN A1C: Hgb A1c MFr Bld: 5.4 % (ref 4.6–6.5)

## 2019-04-25 LAB — LIPID PANEL
Cholesterol: 135 mg/dL (ref 0–200)
HDL: 50.9 mg/dL (ref >39.00)
LDL Cholesterol: 63 mg/dL (ref 0–99)
NonHDL: 83.82
Total CHOL/HDL Ratio: 3
Triglycerides: 104 mg/dL (ref 0.0–149.0)
VLDL: 20.8 mg/dL (ref 0.0–40.0)

## 2019-04-25 MED ORDER — NITROGLYCERIN 0.4 MG SL SUBL: 0.4000 mg | SUBLINGUAL_TABLET | SUBLINGUAL | 1 refills | Status: DC | PRN

## 2019-04-25 NOTE — Assessment & Plan Note (Signed)
Chronic, update FLP on rosuvastatin. The 10-year ASCVD risk score Mikey Bussing DC Brooke Bonito., et al., 2013) is: 15.4%   Values used to calculate the score:     Age: 68 years     Sex: Male     Is Non-Hispanic African American: No     Diabetic: No     Tobacco smoker: No     Systolic Blood Pressure: 972 mmHg     Is BP treated: Yes     HDL Cholesterol: 37.1 mg/dL     Total Cholesterol: 132 mg/dL

## 2019-04-25 NOTE — Assessment & Plan Note (Signed)
Chronic, stable. Continue current regimen. 

## 2019-04-25 NOTE — Assessment & Plan Note (Signed)
With mild polycythemia - update today.

## 2019-04-25 NOTE — Assessment & Plan Note (Signed)
Overall stable exam.

## 2019-04-25 NOTE — Progress Notes (Signed)
This visit was conducted in person.  BP 134/82 (BP Location: Left Arm, Patient Position: Sitting, Cuff Size: Normal)    Pulse 60    Temp 97.9 F (36.6 C) (Temporal)    Ht _0  (1.727 m)    Wt 177 lb 4 oz (80.4 kg)    SpO2 98%    BMI 26.95 kg/m    CC: medicare wellness visit  Subjective:    Patient ID: Joseph Shea, male    DOB: 1951/02/14, 67 y.o.   MRN: [id number]  HPI: Joseph Shea is a 68 y.o. male presenting on 04/25/2019 for Welcome to Medicare and Knee Pain (C/o bilateral knee pain, left worse.  Pain occurs off and on, worsening. Tried Advil and Voltaren gel.)   Received medicare 01/2018 so outside of window for welcome to medicare visit.  Has lost weight - trying.    Hearing Screening   _1  _2  _3  _4  _5  _6  _7  _8  _9   Right ear:   _10 0    Left ear:   _11 40      Visual Acuity Screening   Right eye Left eye Both eyes  Without correction:     With correction: _12   Fall Risk  04/25/2019  Falls in the past year? 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 Planning to return to work.   Noticing L>R knee pain over the past year. Using voltaren gel. Some R back ache as well. States he has h/o flat footed and bow legged-ness.   Preventative: Colon cancer screening - would like stool kit this year, considering colonoscopy next year Prostate cancer screening - increased nocturia 2-3x. Wants continued testing.  Declines flu  Tdap 2017 Pneumovax - today shingrix - discussed Advanced directives - has packet at home. Planning to work on this. Would want wife to be HCPOA.  Seat belt use discussed Sunscreen use discussed. No changing moles on skin.  Non smoker -wife smokes outside  Alcohol - none  Dentist - due Eye exam - due for dental work Bowel -  no constipation Bladder - no incontinence  Lives with wife.  Occupation: Truck Geophysicist/field seismologist, [employer] architect Activity: rides bike 2-3 x/wk a few miles, tries to walk Diet: fruits/vegetables daily, small amt water     Relevant past medical, surgical, family and social history reviewed and updated as indicated. Interim medical history since our last visit reviewed. Allergies and medications reviewed and updated. Outpatient Medications Prior to Visit  Medication Sig Dispense Refill   aspirin 81 MG tablet Take 1 tablet (81 mg total) by mouth daily. 30 tablet    lisinopril (PRINIVIL,ZESTRIL) 40 MG tablet Take 1 tablet (40 mg total) by mouth daily. 90 tablet 3   metoprolol tartrate (LOPRESSOR) 25 MG tablet Take 1 tablet (25 mg total) by mouth 2 (two) times daily. 180 tablet 3   Multiple Vitamin (MULTIVITAMIN PO) Take 1 tablet by mouth daily.       rosuvastatin (CRESTOR) 20 MG tablet Take 1 tablet (20 mg total) by mouth daily. 90 tablet 3   nitroGLYCERIN (NITROSTAT) 0.4 MG SL tablet Place 1 tablet (0.4 mg total) under the tongue every 5 (five) minutes as needed. Reported on 10/16/2015 30 tablet 1   No facility-administered medications prior  to visit.      Per HPI unless specifically indicated in ROS section below Review of Systems  Constitutional: Negative for activity change, appetite change, chills, fatigue, fever and unexpected weight change.  HENT: Negative for hearing loss.   Eyes: Negative for visual disturbance.  Respiratory: Negative for cough, chest tightness, shortness of breath and wheezing.   Cardiovascular: Negative for chest pain, palpitations and leg swelling.  Gastrointestinal: Negative for abdominal distention, abdominal pain, blood in stool, constipation, diarrhea, nausea and vomiting.  Genitourinary: Negative for difficulty urinating and hematuria.  Musculoskeletal: Negative for arthralgias, myalgias and neck pain.  Skin: Negative for rash.  Neurological: Negative for  dizziness, seizures, syncope and headaches.  Hematological: Negative for adenopathy. Does not bruise/bleed easily.  Psychiatric/Behavioral: Negative for dysphoric mood. The patient is not nervous/anxious.    Objective:    BP 134/82 (BP Location: Left Arm, Patient Position: Sitting, Cuff Size: Normal)    Pulse 60    Temp 97.9 F (36.6 C) (Temporal)    Ht _0  (1.727 m)    Wt 177 lb 4 oz (80.4 kg)    SpO2 98%    BMI 26.95 kg/m   Wt Readings from Last 3 Encounters:  04/25/19 177 lb 4 oz (80.4 kg)  10/25/18 189 lb 8 oz (86 kg)  05/17/18 190 lb (86.2 kg)    Physical Exam Vitals signs and nursing note reviewed.  Constitutional:      General: He is not in acute distress.    Appearance: Normal appearance. He is well-developed. He is not ill-appearing.  HENT:     Head: Normocephalic and atraumatic.     Right Ear: Hearing, tympanic membrane, ear canal and external ear normal.     Left Ear: Hearing, tympanic membrane, ear canal and external ear normal.     Nose: Nose normal.     Mouth/Throat:     Mouth: Mucous membranes are moist.     Pharynx: Uvula midline. No oropharyngeal exudate or posterior oropharyngeal erythema.  Eyes:     General: No scleral icterus.    Extraocular Movements: Extraocular movements intact.     Conjunctiva/sclera: Conjunctivae normal.     Pupils: Pupils are equal, round, and reactive to light.  Neck:     Musculoskeletal: Normal range of motion and neck supple.  Cardiovascular:     Rate and Rhythm: Normal rate and regular rhythm.     Pulses: Normal pulses.          Radial pulses are 2+ on the right side and 2+ on the left side.     Heart sounds: Normal heart sounds. No murmur.  Pulmonary:     Effort: Pulmonary effort is normal. No respiratory distress.     Breath sounds: Normal breath sounds. No wheezing, rhonchi or rales.  Abdominal:     General: Abdomen is flat. Bowel sounds are normal. There is no distension.     Palpations: Abdomen is soft. There is no  mass.     Tenderness: There is no abdominal tenderness. There is no guarding or rebound.     Hernia: No hernia is present.  Musculoskeletal: Normal range of motion.     Right lower leg: No edema.     Left lower leg: No edema.  Lymphadenopathy:     Cervical: No cervical adenopathy.  Skin:    General: Skin is warm and dry.     Findings: No rash.  Neurological:     General: No focal deficit present.  Mental Status: He is alert and oriented to person, place, and time.     Comments:  CN grossly intact, station and gait intact Recall 3/3  Calculation [id number]  Psychiatric:        Mood and Affect: Mood normal.        Behavior: Behavior normal.        Thought Content: Thought content normal.        Judgment: Judgment normal.       Results for orders placed or performed in visit on 10/25/18  Lipid panel  Result Value Ref Range   Cholesterol 132 0 - 200 mg/dL   Triglycerides 186.0 (H) 0.0 - 149.0 mg/dL   HDL 37.10 (L) >39.00 mg/dL   VLDL 37.2 0.0 - 40.0 mg/dL   LDL Cholesterol 57 0 - 99 mg/dL   Total CHOL/HDL Ratio 4    NonHDL 94.56   Hemoglobin A1c  Result Value Ref Range   Hgb A1c MFr Bld 5.9 4.6 - 6.5 %  Comprehensive metabolic panel  Result Value Ref Range   Sodium 138 135 - 145 mEq/L   Potassium 4.8 3.5 - 5.1 mEq/L   Chloride 101 96 - 112 mEq/L   CO2 29 19 - 32 mEq/L   Glucose, Bld 162 (H) 70 - 99 mg/dL   BUN 17 6 - 23 mg/dL   Creatinine, Ser 0.92 0.40 - 1.50 mg/dL   Total Bilirubin 1.5 (H) 0.2 - 1.2 mg/dL   Alkaline Phosphatase 75 39 - 117 U/L   AST 17 0 - 37 U/L   ALT 43 0 - 53 U/L   Total Protein 6.7 6.0 - 8.3 g/dL   Albumin 4.8 3.5 - 5.2 g/dL   Calcium 10.1 8.4 - 10.5 mg/dL   GFR 82.39 >60.00 mL/min  PSA  Result Value Ref Range   PSA 0.45 0.10 - 4.00 ng/mL  CBC with Differential/Platelet  Result Value Ref Range   WBC 6.9 4.0 - 10.5 K/uL   RBC 5.29 4.22 - 5.81 Mil/uL   Hemoglobin 17.1 (H) 13.0 - 17.0 g/dL   HCT 48.5 39.0 - 52.0 %   MCV 91.5 78.0 -  100.0 fl   MCHC 35.2 30.0 - 36.0 g/dL   RDW 13.6 11.5 - 15.5 %   Platelets 128.0 (L) 150.0 - 400.0 K/uL   Neutrophils Relative % 76.3 43.0 - 77.0 %   Lymphocytes Relative 15.7 12.0 - 46.0 %   Monocytes Relative 7.3 3.0 - 12.0 %   Eosinophils Relative 0.3 0.0 - 5.0 %   Basophils Relative 0.4 0.0 - 3.0 %   Neutro Abs 5.3 1.4 - 7.7 K/uL   Lymphs Abs 1.1 0.7 - 4.0 K/uL   Monocytes Absolute 0.5 0.1 - 1.0 K/uL   Eosinophils Absolute 0.0 0.0 - 0.7 K/uL   Basophils Absolute 0.0 0.0 - 0.1 K/uL   Assessment & Plan:   Problem List Items Addressed This Visit    Thrombocytopenia ([place])    With mild polycythemia - update today.       Relevant Orders   CBC with Differential/Platelet   Mixed hyperlipidemia    Chronic, update FLP on rosuvastatin. The 10-year ASCVD risk score Mikey Bussing DC Jr., et al., 2013) is: 15.4%   Values used to calculate the score:     Age: 84 years     Sex: Male     Is Non-Hispanic African American: No     Diabetic: No     Tobacco smoker: No  Systolic Blood Pressure: 938 mmHg     Is BP treated: Yes     HDL Cholesterol: 37.1 mg/dL     Total Cholesterol: 132 mg/dL       Relevant Medications   nitroGLYCERIN (NITROSTAT) 0.4 MG SL tablet   Other Relevant Orders   Lipid panel   Medicare annual wellness visit, initial - Primary    I have personally reviewed the Medicare Annual Wellness questionnaire and have noted 1. The patient's medical and social history 2. Their use of alcohol, tobacco or illicit drugs 3. Their current medications and supplements 4. The patient's functional ability including ADL's, fall risks, home safety risks and hearing or visual impairment. Cognitive function has been assessed and addressed as indicated.  5. Diet and physical activity 6. Evidence for depression or mood disorders The patients weight, height, BMI have been recorded in the chart. I have made referrals, counseling and provided education to the patient based on review of the above  and I have provided the pt with a written personalized care plan for preventive services. Provider list updated.. See scanned questionairre as needed for further documentation. Reviewed preventative protocols and updated unless pt declined.       HYPERTENSION, BENIGN ESSENTIAL    Chronic, stable. Continue current regimen.       Relevant Medications   nitroGLYCERIN (NITROSTAT) 0.4 MG SL tablet   Hyperglycemia    Update A1c.       Relevant Orders   Basic metabolic panel   Hemoglobin A1c   Healthcare maintenance    Preventative protocols reviewed and updated unless pt declined. Discussed healthy diet and lifestyle.       Benign prostatic hyperplasia    Overall stable exam.      Advanced care planning/counseling discussion    Advanced directives - has packet at home. Planning to work on this. Would want wife to be HCPOA.        Other Visit Diagnoses    Special screening for malignant neoplasms, colon       Relevant Orders   Fecal occult blood, imunochemical   Need for 23-polyvalent pneumococcal polysaccharide vaccine       Relevant Orders   Pneumococcal polysaccharide vaccine 23-valent greater than or equal to 2yo subcutaneous/IM (Completed)       Meds ordered this encounter  Medications   nitroGLYCERIN (NITROSTAT) 0.4 MG SL tablet    Sig: Place 1 tablet (0.4 mg total) under the tongue every 5 (five) minutes as needed. Reported on 10/16/2015    Dispense:  30 tablet    Refill:  1   Orders Placed This Encounter  Procedures   Fecal occult blood, imunochemical    Standing Status:   Future    Standing Expiration Date:   04/24/2020   Pneumococcal polysaccharide vaccine 23-valent greater than or equal to 2yo subcutaneous/IM   CBC with Differential/Platelet   Basic metabolic panel   Hemoglobin A1c   Lipid panel    Follow up plan: Return in about 1 year (around 04/24/2020) for annual exam, prior fasting for blood work.  Ria Bush, MD

## 2019-04-25 NOTE — Assessment & Plan Note (Signed)

## 2019-04-25 NOTE — Patient Instructions (Addendum)
Pneumovax 23 today (pneumonia shot).  Labs today.  If interested, check with pharmacy about new 2 shot shingles series (shingrix).  You are doing well today. Return as needed or in 1 year for next physical.  Health Maintenance After Age 66 After age 10, you are at a higher risk for certain long-term diseases and infections as well as injuries from falls. Falls are a major cause of broken bones and head injuries in people who are older than age 23. Getting regular preventive care can help to keep you healthy and well. Preventive care includes getting regular testing and making lifestyle changes as recommended by your health care provider. Talk with your health care provider about:  Which screenings and tests you should have. A screening is a test that checks for a disease when you have no symptoms.  A diet and exercise plan that is right for you. What should I know about screenings and tests to prevent falls? Screening and testing are the best ways to find a health problem early. Early diagnosis and treatment give you the best chance of managing medical conditions that are common after age 80. Certain conditions and lifestyle choices may make you more likely to have a fall. Your health care provider may recommend:  Regular vision checks. Poor vision and conditions such as cataracts can make you more likely to have a fall. If you wear glasses, make sure to get your prescription updated if your vision changes.  Medicine review. Work with your health care provider to regularly review all of the medicines you are taking, including over-the-counter medicines. Ask your health care provider about any side effects that may make you more likely to have a fall. Tell your health care provider if any medicines that you take make you feel dizzy or sleepy.  Osteoporosis screening. Osteoporosis is a condition that causes the bones to get weaker. This can make the bones weak and cause them to break more  easily.  Blood pressure screening. Blood pressure changes and medicines to control blood pressure can make you feel dizzy.  Strength and balance checks. Your health care provider may recommend certain tests to check your strength and balance while standing, walking, or changing positions.  Foot health exam. Foot pain and numbness, as well as not wearing proper footwear, can make you more likely to have a fall.  Depression screening. You may be more likely to have a fall if you have a fear of falling, feel emotionally low, or feel unable to do activities that you used to do.  Alcohol use screening. Using too much alcohol can affect your balance and may make you more likely to have a fall. What actions can I take to lower my risk of falls? General instructions  Talk with your health care provider about your risks for falling. Tell your health care provider if: ? You fall. Be sure to tell your health care provider about all falls, even ones that seem minor. ? You feel dizzy, sleepy, or off-balance.  Take over-the-counter and prescription medicines only as told by your health care provider. These include any supplements.  Eat a healthy diet and maintain a healthy weight. A healthy diet includes low-fat dairy products, low-fat (lean) meats, and fiber from whole grains, beans, and lots of fruits and vegetables. Home safety  Remove any tripping hazards, such as rugs, cords, and clutter.  Install safety equipment such as grab bars in bathrooms and safety rails on stairs.  Keep rooms and walkways well-lit.  Activity   Follow a regular exercise program to stay fit. This will help you maintain your balance. Ask your health care provider what types of exercise are appropriate for you.  If you need a cane or walker, use it as recommended by your health care provider.  Wear supportive shoes that have nonskid soles. Lifestyle  Do not drink alcohol if your health care provider tells you not to  drink.  If you drink alcohol, limit how much you have: ? 0-1 drink a day for women. ? 0-2 drinks a day for men.  Be aware of how much alcohol is in your drink. In the [place] one drink equals one typical bottle of beer (12 oz), one-half glass of wine (5 oz), or one shot of hard liquor (1 oz).  Do not use any products that contain nicotine or tobacco, such as cigarettes and e-cigarettes. If you need help quitting, ask your health care provider. Summary  Having a healthy lifestyle and getting preventive care can help to protect your health and wellness after age 66.  Screening and testing are the best way to find a health problem early and help you avoid having a fall. Early diagnosis and treatment give you the best chance for managing medical conditions that are more common for people who are older than age 66.  Falls are a major cause of broken bones and head injuries in people who are older than age 66. Take precautions to prevent a fall at home.  Work with your health care provider to learn what changes you can make to improve your health and wellness and to prevent falls. This information is not intended to replace advice given to you by your health care provider. Make sure you discuss any questions you have with your health care provider. Document Released: 07/22/2017 Document Revised: 12/30/2018 Document Reviewed: 07/22/2017 Elsevier Patient Education  2020 [employer]

## 2019-04-25 NOTE — Assessment & Plan Note (Signed)
Update A1c ?

## 2019-04-25 NOTE — Assessment & Plan Note (Signed)
Advanced directives - has packet at home. Planning to work on this. Would want wife to be HCPOA.

## 2019-04-25 NOTE — Assessment & Plan Note (Signed)
Preventative protocols reviewed and updated unless pt declined. Discussed healthy diet and lifestyle.  

## 2019-05-23 DIAGNOSIS — R69 Illness, unspecified: Secondary | ICD-10-CM | POA: Diagnosis not present

## 2019-06-22 DIAGNOSIS — R69 Illness, unspecified: Secondary | ICD-10-CM | POA: Diagnosis not present

## 2019-08-09 ENCOUNTER — Telehealth: Payer: Self-pay | Admitting: Family Medicine

## 2019-08-09 DIAGNOSIS — G8929 Other chronic pain: Secondary | ICD-10-CM

## 2019-08-09 NOTE — Telephone Encounter (Signed)
Patient stated that he has been dealing with knee pain for several years. He wanted to know if you could refer him to a specialist in [place] due to the knee pain not improving.  He wanted to see someone that could possibly do injections

## 2019-08-10 NOTE — Telephone Encounter (Signed)
referral placed

## 2019-08-10 NOTE — Telephone Encounter (Signed)
Appt scheduled with [facility] and patient is aware.

## 2019-08-12 DIAGNOSIS — M170 Bilateral primary osteoarthritis of knee: Secondary | ICD-10-CM | POA: Diagnosis not present

## 2019-08-15 ENCOUNTER — Telehealth: Payer: Self-pay | Admitting: Family Medicine

## 2019-08-15 NOTE — Telephone Encounter (Signed)
Meloxicam and anti inflammatories in general are not ideal in his situation for many reasons including increased cardiovascular risk (with his cardiac history) and they don't let aspirin work as well.  Recommend he cut 15mg  in half and ideally use limited course 1-2 wks at a time.  Would prefer he start with tylenol 500mg  1-2 tab 2-3 times a day (max 3000mg  daily)

## 2019-08-15 NOTE — Telephone Encounter (Signed)
Patient saw Dr.Howard Sabra Heck on Friday for knee pain.  Dr.Miller prescribed Meloxicam 15 mg.  Dr.Miller asked patient to call Dr.G to make sure he can take Meloxicam with his other medications.

## 2019-08-15 NOTE — Telephone Encounter (Signed)
Spoke pt/pt's wife, Manuela Schwartz, relaying Dr. Synthia Innocent message.  They verbalize understanding and will try Tylenol first.

## 2019-11-03 ENCOUNTER — Other Ambulatory Visit: Payer: Self-pay | Admitting: Family Medicine

## 2019-11-18 ENCOUNTER — Other Ambulatory Visit: Payer: Self-pay | Admitting: Family Medicine

## 2019-12-22 DIAGNOSIS — R69 Illness, unspecified: Secondary | ICD-10-CM | POA: Diagnosis not present

## 2019-12-22 DIAGNOSIS — Z9989 Dependence on other enabling machines and devices: Secondary | ICD-10-CM | POA: Diagnosis not present

## 2019-12-22 DIAGNOSIS — M5441 Lumbago with sciatica, right side: Secondary | ICD-10-CM | POA: Diagnosis not present

## 2020-01-17 ENCOUNTER — Other Ambulatory Visit: Payer: Self-pay | Admitting: Family Medicine

## 2020-04-03 ENCOUNTER — Other Ambulatory Visit: Payer: Self-pay

## 2020-04-03 ENCOUNTER — Encounter: Payer: Self-pay | Admitting: Family

## 2020-04-03 ENCOUNTER — Ambulatory Visit: Payer: Medicare HMO | Admitting: Family

## 2020-04-03 VITALS — BP 130/72 | HR 80 | Ht 68.5 in | Wt 181.0 lb

## 2020-04-03 DIAGNOSIS — I10 Essential (primary) hypertension: Secondary | ICD-10-CM

## 2020-04-03 DIAGNOSIS — E785 Hyperlipidemia, unspecified: Secondary | ICD-10-CM

## 2020-04-03 DIAGNOSIS — I25118 Atherosclerotic heart disease of native coronary artery with other forms of angina pectoris: Secondary | ICD-10-CM | POA: Diagnosis not present
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
## 2020-04-03 NOTE — Patient Instructions (Addendum)
Medication Instructions:  No medication changes today.   You may use Tylenol 500mg  1-2 tablets as needed 2-3 times per day for arthritis pain.  May use anti-inflammatories on an as needed basis once or twice per week. These are not preferred as they impact your cardiovascular health. We would prefer you start with Tylenol.  *If you need a refill on your cardiac medications before your next appointment, please call your pharmacy*  Lab Work: No lab work today.   Testing/Procedures: Your your EKG today shows normal sinus rhythm which is a great result!  Follow-Up: At [facility], you and your health needs are our priority.  As part of our continuing mission to provide you with exceptional heart care, we have created designated Provider Care Teams.  These Care Teams include your primary Cardiologist (physician) and Advanced Practice Providers (APPs -  Physician Assistants and Nurse Practitioners) who all work together to provide you with the care you need, when you need it.  We recommend signing up for the patient portal called "MyChart".  Sign up information is provided on this After Visit Summary.  MyChart is used to connect with patients for Virtual Visits (Telemedicine).  Patients are able to view lab/test results, encounter notes, upcoming appointments, etc.  Non-urgent messages can be sent to your provider as well.   To learn more about what you can do with MyChart, go to [facility].    Your next appointment:  In 1 year with [email or web address] or APP.    Fat and Cholesterol Restricted Eating Plan Getting too much fat and cholesterol in your diet may cause health problems. Choosing the right foods helps keep your fat and cholesterol at normal levels. This can keep you from getting certain diseases. What are tips for following this plan? Meal planning  At meals, divide your plate into four equal parts: ? Fill one-half of your plate with vegetables and green salads. ? Fill  one-fourth of your plate with whole grains. ? Fill one-fourth of your plate with low-fat (lean) protein foods.  Eat fish that is high in omega-3 fats at least two times a week. This includes mackerel, tuna, sardines, and salmon.  Eat foods that are high in fiber, such as whole grains, beans, apples, broccoli, carrots, peas, and barley. General tips   Work with your doctor to lose weight if you need to.  Avoid: ? Foods with added sugar. ? Fried foods. ? Foods with partially hydrogenated oils.  Limit alcohol intake to no more than 1 drink a day for nonpregnant women and 2 drinks a day for men. One drink equals 12 oz of beer, 5 oz of wine, or 1 oz of hard liquor. Reading food labels  Check food labels for: ? Trans fats. ? Partially hydrogenated oils. ? Saturated fat (g) in each serving. ? Cholesterol (mg) in each serving. ? Fiber (g) in each serving.  Choose foods with healthy fats, such as: ? Monounsaturated fats. ? Polyunsaturated fats. ? Omega-3 fats.  Choose grain products that have whole grains. Look for the word "whole" as the first word in the ingredient list. Cooking  Cook foods using low-fat methods. These include baking, boiling, grilling, and broiling.  Eat more home-cooked foods. Eat at restaurants and buffets less often.  Avoid cooking using saturated fats, such as butter, cream, palm oil, palm kernel oil, and coconut oil. Recommended foods  Fruits  All fresh, canned (in natural juice), or frozen fruits. Vegetables  Fresh or frozen vegetables (raw, steamed, roasted,  or grilled). Green salads. Grains  Whole grains, such as whole wheat or whole grain breads, crackers, cereals, and pasta. Unsweetened oatmeal, bulgur, barley, quinoa, or brown rice. Corn or whole wheat flour tortillas. Meats and other protein foods  Ground beef (85% or leaner), grass-fed beef, or beef trimmed of fat. Skinless chicken or [place]. Ground chicken or [place]. Pork trimmed of fat.  All fish and seafood. Egg whites. Dried beans, peas, or lentils. Unsalted nuts or seeds. Unsalted canned beans. Nut butters without added sugar or oil. Dairy  Low-fat or nonfat dairy products, such as skim or 1% milk, 2% or reduced-fat cheeses, low-fat and fat-free ricotta or cottage cheese, or plain low-fat and nonfat yogurt. Fats and oils  Tub margarine without trans fats. Light or reduced-fat mayonnaise and salad dressings. Avocado. Olive, canola, sesame, or safflower oils. The items listed above may not be a complete list of foods and beverages you can eat. Contact a dietitian for more information. Foods to avoid Fruits  Canned fruit in heavy syrup. Fruit in cream or butter sauce. Fried fruit. Vegetables  Vegetables cooked in cheese, cream, or butter sauce. Fried vegetables. Grains  White bread. White pasta. White rice. Cornbread. Bagels, pastries, and croissants. Crackers and snack foods that contain trans fat and hydrogenated oils. Meats and other protein foods  Fatty cuts of meat. Ribs, chicken wings, bacon, sausage, bologna, salami, chitterlings, fatback, hot dogs, bratwurst, and packaged lunch meats. Liver and organ meats. Whole eggs and egg yolks. Chicken and [place] with skin. Fried meat. Dairy  Whole or 2% milk, cream, half-and-half, and cream cheese. Whole milk cheeses. Whole-fat or sweetened yogurt. Full-fat cheeses. Nondairy creamers and whipped toppings. Processed cheese, cheese spreads, and cheese curds. Beverages  Alcohol. Sugar-sweetened drinks such as sodas, lemonade, and fruit drinks. Fats and oils  Butter, stick margarine, lard, shortening, ghee, or bacon fat. Coconut, palm kernel, and palm oils. Sweets and desserts  Corn syrup, sugars, honey, and molasses. Candy. Jam and jelly. Syrup. Sweetened cereals. Cookies, pies, cakes, donuts, muffins, and ice cream. The items listed above may not be a complete list of foods and beverages you should avoid. Contact a  dietitian for more information. Summary  Choosing the right foods helps keep your fat and cholesterol at normal levels. This can keep you from getting certain diseases.  At meals, fill one-half of your plate with vegetables and green salads.  Eat high-fiber foods, like whole grains, beans, apples, carrots, peas, and barley.  Limit added sugar, saturated fats, alcohol, and fried foods. This information is not intended to replace advice given to you by your health care provider. Make sure you discuss any questions you have with your health care provider. Document Revised: 05/12/2018 Document Reviewed: 05/26/2017 Elsevier Patient Education  2020 [employer].

## 2020-04-25 ENCOUNTER — Ambulatory Visit (INDEPENDENT_AMBULATORY_CARE_PROVIDER_SITE_OTHER): Payer: Medicare HMO

## 2020-04-25 ENCOUNTER — Other Ambulatory Visit (INDEPENDENT_AMBULATORY_CARE_PROVIDER_SITE_OTHER): Payer: Medicare HMO

## 2020-04-25 ENCOUNTER — Other Ambulatory Visit: Payer: Self-pay | Admitting: Family Medicine

## 2020-04-25 ENCOUNTER — Other Ambulatory Visit: Payer: Self-pay

## 2020-04-25 DIAGNOSIS — E782 Mixed hyperlipidemia: Secondary | ICD-10-CM

## 2020-04-25 DIAGNOSIS — D696 Thrombocytopenia, unspecified: Secondary | ICD-10-CM | POA: Diagnosis not present

## 2020-04-25 DIAGNOSIS — R739 Hyperglycemia, unspecified: Secondary | ICD-10-CM

## 2020-04-25 DIAGNOSIS — N400 Enlarged prostate without lower urinary tract symptoms: Secondary | ICD-10-CM | POA: Diagnosis not present

## 2020-04-25 DIAGNOSIS — Z0000 Encounter for general adult medical examination without abnormal findings: Secondary | ICD-10-CM

## 2020-04-25 LAB — COMPREHENSIVE METABOLIC PANEL
ALT: 36 U/L (ref 0–53)
AST: 22 U/L (ref 0–37)
Albumin: 4.8 g/dL (ref 3.5–5.2)
Alkaline Phosphatase: 80 U/L (ref 39–117)
BUN: 17 mg/dL (ref 6–23)
CO2: 29 mEq/L (ref 19–32)
Calcium: 9.6 mg/dL (ref 8.4–10.5)
Chloride: 103 mEq/L (ref 96–112)
Creatinine, Ser: 0.91 mg/dL (ref 0.40–1.50)
GFR: 83.05 mL/min (ref >60.00)
Glucose, Bld: 160 mg/dL — ABNORMAL HIGH (ref 70–99)
Potassium: 4.9 mEq/L (ref 3.5–5.1)
Sodium: 136 mEq/L (ref 135–145)
Total Bilirubin: 1.5 mg/dL — ABNORMAL HIGH (ref 0.2–1.2)
Total Protein: 6.7 g/dL (ref 6.0–8.3)

## 2020-04-25 LAB — CBC WITH DIFFERENTIAL/PLATELET
Basophils Absolute: 0 10*3/uL (ref 0.0–0.1)
Basophils Relative: 0.5 % (ref 0.0–3.0)
Eosinophils Absolute: 0.1 10*3/uL (ref 0.0–0.7)
Eosinophils Relative: 0.7 % (ref 0.0–5.0)
HCT: 46.1 % (ref 39.0–52.0)
Hemoglobin: 16.2 g/dL (ref 13.0–17.0)
Lymphocytes Relative: 18.2 % (ref 12.0–46.0)
Lymphs Abs: 1.3 10*3/uL (ref 0.7–4.0)
MCHC: 35.2 g/dL (ref 30.0–36.0)
MCV: 93 fl (ref 78.0–100.0)
Monocytes Absolute: 0.6 10*3/uL (ref 0.1–1.0)
Monocytes Relative: 8.4 % (ref 3.0–12.0)
Neutro Abs: 5 10*3/uL (ref 1.4–7.7)
Neutrophils Relative %: 72.2 % (ref 43.0–77.0)
Platelets: 117 10*3/uL — ABNORMAL LOW (ref 150.0–400.0)
RBC: 4.95 Mil/uL (ref 4.22–5.81)
RDW: 13.4 % (ref 11.5–15.5)
WBC: 7 10*3/uL (ref 4.0–10.5)

## 2020-04-25 LAB — LIPID PANEL
Cholesterol: 129 mg/dL (ref 0–200)
HDL: 52.1 mg/dL (ref >39.00)
LDL Cholesterol: 56 mg/dL (ref 0–99)
NonHDL: 77.14
Total CHOL/HDL Ratio: 2
Triglycerides: 107 mg/dL (ref 0.0–149.0)
VLDL: 21.4 mg/dL (ref 0.0–40.0)

## 2020-04-25 LAB — PSA: PSA: 0.44 ng/mL (ref 0.10–4.00)

## 2020-04-25 LAB — HEMOGLOBIN A1C: Hgb A1c MFr Bld: 5.8 % (ref 4.6–6.5)

## 2020-04-25 NOTE — Patient Instructions (Signed)
[name] , Thank you for taking time to come for your Medicare Wellness Visit. I appreciate your ongoing commitment to your health goals. Please review the following plan we discussed and let me know if I can assist you in the future.   Screening recommendations/referrals: Colonoscopy: due, will do FOBT  Recommended yearly ophthalmology/optometry visit for glaucoma screening and checkup Recommended yearly dental visit for hygiene and checkup  Vaccinations: Influenza vaccine: due, will be available in the office at the end of August Pneumococcal vaccine: due, will complete at upcoming physical Tdap vaccine: Up to date, completed 10/16/2015, due 09/2025 Shingles vaccine: due, check with your insurance regarding coverage    Covid-19: Completed series  Advanced directives: Please bring a copy of your POA (Power of Reeder) and/or Living Will to your next appointment.   Conditions/risks identified: hypertension, hyperlipidemia  Next appointment: Follow up in one year for your annual wellness visit.   Preventive Care 5 Years and Older, Male Preventive care refers to lifestyle choices and visits with your health care provider that can promote health and wellness. What does preventive care include?  A yearly physical exam. This is also called an annual well check.  Dental exams once or twice a year.  Routine eye exams. Ask your health care provider how often you should have your eyes checked.  Personal lifestyle choices, including:  Daily care of your teeth and gums.  Regular physical activity.  Eating a healthy diet.  Avoiding tobacco and drug use.  Limiting alcohol use.  Practicing safe sex.  Taking low doses of aspirin every day.  Taking vitamin and mineral supplements as recommended by your health care provider. What happens during an annual well check? The services and screenings done by your health care provider during your annual well check will depend on your age,  overall health, lifestyle risk factors, and family history of disease. Counseling  Your health care provider may ask you questions about your:  Alcohol use.  Tobacco use.  Drug use.  Emotional well-being.  Home and relationship well-being.  Sexual activity.  Eating habits.  History of falls.  Memory and ability to understand (cognition).  Work and work Astronomer. Screening  You may have the following tests or measurements:  Height, weight, and BMI.  Blood pressure.  Lipid and cholesterol levels. These may be checked every 5 years, or more frequently if you are over 60 years old.  Skin check.  Lung cancer screening. You may have this screening every year starting at age 75 if you have a 30-pack-year history of smoking and currently smoke or have quit within the past 15 years.  Fecal occult blood test (FOBT) of the stool. You may have this test every year starting at age 23.  Flexible sigmoidoscopy or colonoscopy. You may have a sigmoidoscopy every 5 years or a colonoscopy every 10 years starting at age 81.  Prostate cancer screening. Recommendations will vary depending on your family history and other risks.  Hepatitis C blood test.  Hepatitis B blood test.  Sexually transmitted disease (STD) testing.  Diabetes screening. This is done by checking your blood sugar (glucose) after you have not eaten for a while (fasting). You may have this done every 1-3 years.  Abdominal aortic aneurysm (AAA) screening. You may need this if you are a current or former smoker.  Osteoporosis. You may be screened starting at age 44 if you are at high risk. Talk with your health care provider about your test results, treatment options,  and if necessary, the need for more tests. Vaccines  Your health care provider may recommend certain vaccines, such as:  Influenza vaccine. This is recommended every year.  Tetanus, diphtheria, and acellular pertussis (Tdap, Td) vaccine. You may  need a Td booster every 10 years.  Zoster vaccine. You may need this after age 74.  Pneumococcal 13-valent conjugate (PCV13) vaccine. One dose is recommended after age 101.  Pneumococcal polysaccharide (PPSV23) vaccine. One dose is recommended after age 40. Talk to your health care provider about which screenings and vaccines you need and how often you need them. This information is not intended to replace advice given to you by your health care provider. Make sure you discuss any questions you have with your health care provider. Document Released: 10/05/2015 Document Revised: 05/28/2016 Document Reviewed: 07/10/2015 Elsevier Interactive Patient Education  2017 [place] Prevention in the Home Falls can cause injuries. They can happen to people of all ages. There are many things you can do to make your home safe and to help prevent falls. What can I do on the outside of my home?  Regularly fix the edges of walkways and driveways and fix any cracks.  Remove anything that might make you trip as you walk through a door, such as a raised step or threshold.  Trim any bushes or trees on the path to your home.  Use bright outdoor lighting.  Clear any walking paths of anything that might make someone trip, such as rocks or tools.  Regularly check to see if handrails are loose or broken. Make sure that both sides of any steps have handrails.  Any raised decks and porches should have guardrails on the edges.  Have any leaves, snow, or ice cleared regularly.  Use sand or salt on walking paths during winter.  Clean up any spills in your garage right away. This includes oil or grease spills. What can I do in the bathroom?  Use night lights.  Install grab bars by the toilet and in the tub and shower. Do not use towel bars as grab bars.  Use non-skid mats or decals in the tub or shower.  If you need to sit down in the shower, use a plastic, non-slip stool.  Keep the floor  dry. Clean up any water that spills on the floor as soon as it happens.  Remove soap buildup in the tub or shower regularly.  Attach bath mats securely with double-sided non-slip rug tape.  Do not have throw rugs and other things on the floor that can make you trip. What can I do in the bedroom?  Use night lights.  Make sure that you have a light by your bed that is easy to reach.  Do not use any sheets or blankets that are too big for your bed. They should not hang down onto the floor.  Have a firm chair that has side arms. You can use this for support while you get dressed.  Do not have throw rugs and other things on the floor that can make you trip. What can I do in the kitchen?  Clean up any spills right away.  Avoid walking on wet floors.  Keep items that you use a lot in easy-to-reach places.  If you need to reach something above you, use a strong step stool that has a grab bar.  Keep electrical cords out of the way.  Do not use floor polish or wax that makes floors slippery. If  you must use wax, use non-skid floor wax.  Do not have throw rugs and other things on the floor that can make you trip. What can I do with my stairs?  Do not leave any items on the stairs.  Make sure that there are handrails on both sides of the stairs and use them. Fix handrails that are broken or loose. Make sure that handrails are as long as the stairways.  Check any carpeting to make sure that it is firmly attached to the stairs. Fix any carpet that is loose or worn.  Avoid having throw rugs at the top or bottom of the stairs. If you do have throw rugs, attach them to the floor with carpet tape.  Make sure that you have a light switch at the top of the stairs and the bottom of the stairs. If you do not have them, ask someone to add them for you. What else can I do to help prevent falls?  Wear shoes that:  Do not have high heels.  Have rubber bottoms.  Are comfortable and fit you  well.  Are closed at the toe. Do not wear sandals.  If you use a stepladder:  Make sure that it is fully opened. Do not climb a closed stepladder.  Make sure that both sides of the stepladder are locked into place.  Ask someone to hold it for you, if possible.  Clearly mark and make sure that you can see:  Any grab bars or handrails.  First and last steps.  Where the edge of each step is.  Use tools that help you move around (mobility aids) if they are needed. These include:  Canes.  Walkers.  Scooters.  Crutches.  Turn on the lights when you go into a dark area. Replace any light bulbs as soon as they burn out.  Set up your furniture so you have a clear path. Avoid moving your furniture around.  If any of your floors are uneven, fix them.  If there are any pets around you, be aware of where they are.  Review your medicines with your doctor. Some medicines can make you feel dizzy. This can increase your chance of falling. Ask your doctor what other things that you can do to help prevent falls. This information is not intended to replace advice given to you by your health care provider. Make sure you discuss any questions you have with your health care provider. Document Released: 07/05/2009 Document Revised: 02/14/2016 Document Reviewed: 10/13/2014 Elsevier Interactive Patient Education  2017 [employer].

## 2020-04-25 NOTE — Progress Notes (Signed)
PCP notes:  Health Maintenance: Prevnar 13- due Flu- due Colonoscopy- due, will complete FOBT instead   Abnormal Screenings: none   Patient concerns: none   Nurse concerns: none   Next PCP appt.: 04/27/2020 @ 8:30 am
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
## 2020-04-27 ENCOUNTER — Encounter: Payer: Self-pay | Admitting: Family Medicine

## 2020-04-27 ENCOUNTER — Other Ambulatory Visit: Payer: Self-pay

## 2020-04-27 ENCOUNTER — Ambulatory Visit (INDEPENDENT_AMBULATORY_CARE_PROVIDER_SITE_OTHER): Payer: Medicare HMO | Admitting: Family Medicine

## 2020-04-27 VITALS — BP 120/70 | HR 74 | Temp 97.7°F | Ht 68.0 in | Wt 179.2 lb

## 2020-04-27 DIAGNOSIS — I10 Essential (primary) hypertension: Secondary | ICD-10-CM

## 2020-04-27 DIAGNOSIS — E782 Mixed hyperlipidemia: Secondary | ICD-10-CM | POA: Diagnosis not present

## 2020-04-27 DIAGNOSIS — Z0000 Encounter for general adult medical examination without abnormal findings: Secondary | ICD-10-CM | POA: Diagnosis not present

## 2020-04-27 DIAGNOSIS — I2510 Atherosclerotic heart disease of native coronary artery without angina pectoris: Secondary | ICD-10-CM

## 2020-04-27 DIAGNOSIS — E041 Nontoxic single thyroid nodule: Secondary | ICD-10-CM | POA: Insufficient documentation

## 2020-04-27 DIAGNOSIS — Z7189 Other specified counseling: Secondary | ICD-10-CM | POA: Diagnosis not present

## 2020-04-27 DIAGNOSIS — R7303 Prediabetes: Secondary | ICD-10-CM

## 2020-04-27 DIAGNOSIS — D696 Thrombocytopenia, unspecified: Secondary | ICD-10-CM | POA: Diagnosis not present

## 2020-04-27 DIAGNOSIS — Z1211 Encounter for screening for malignant neoplasm of colon: Secondary | ICD-10-CM | POA: Diagnosis not present

## 2020-04-27 MED ORDER — LISINOPRIL 40 MG PO TABS: 40.0000 mg | ORAL_TABLET | Freq: Every day | ORAL | 3 refills | Status: DC

## 2020-04-27 NOTE — Patient Instructions (Addendum)
If interested, check with pharmacy about new 2 shot shingles series (shingrix).  Bring [place] a copy of your advanced directives.  Sugar was a bit elevated - watch added sugars in the diet.  You are doing well today! Continue current regimen.  Return as needed or in 1 year for next physical/wellness visit  We will call you to schedule thyroid ultrasound for possible nodule  Health Maintenance After Age 67 After age 15, you are at a higher risk for certain long-term diseases and infections as well as injuries from falls. Falls are a major cause of broken bones and head injuries in people who are older than age 59. Getting regular preventive care can help to keep you healthy and well. Preventive care includes getting regular testing and making lifestyle changes as recommended by your health care provider. Talk with your health care provider about:  Which screenings and tests you should have. A screening is a test that checks for a disease when you have no symptoms.  A diet and exercise plan that is right for you. What should I know about screenings and tests to prevent falls? Screening and testing are the best ways to find a health problem early. Early diagnosis and treatment give you the best chance of managing medical conditions that are common after age 29. Certain conditions and lifestyle choices may make you more likely to have a fall. Your health care provider may recommend:  Regular vision checks. Poor vision and conditions such as cataracts can make you more likely to have a fall. If you wear glasses, make sure to get your prescription updated if your vision changes.  Medicine review. Work with your health care provider to regularly review all of the medicines you are taking, including over-the-counter medicines. Ask your health care provider about any side effects that may make you more likely to have a fall. Tell your health care provider if any medicines that you take make you feel dizzy or  sleepy.  Osteoporosis screening. Osteoporosis is a condition that causes the bones to get weaker. This can make the bones weak and cause them to break more easily.  Blood pressure screening. Blood pressure changes and medicines to control blood pressure can make you feel dizzy.  Strength and balance checks. Your health care provider may recommend certain tests to check your strength and balance while standing, walking, or changing positions.  Foot health exam. Foot pain and numbness, as well as not wearing proper footwear, can make you more likely to have a fall.  Depression screening. You may be more likely to have a fall if you have a fear of falling, feel emotionally low, or feel unable to do activities that you used to do.  Alcohol use screening. Using too much alcohol can affect your balance and may make you more likely to have a fall. What actions can I take to lower my risk of falls? General instructions  Talk with your health care provider about your risks for falling. Tell your health care provider if: ? You fall. Be sure to tell your health care provider about all falls, even ones that seem minor. ? You feel dizzy, sleepy, or off-balance.  Take over-the-counter and prescription medicines only as told by your health care provider. These include any supplements.  Eat a healthy diet and maintain a healthy weight. A healthy diet includes low-fat dairy products, low-fat (lean) meats, and fiber from whole grains, beans, and lots of fruits and vegetables. Home safety  Remove  any tripping hazards, such as rugs, cords, and clutter.  Install safety equipment such as grab bars in bathrooms and safety rails on stairs.  Keep rooms and walkways well-lit. Activity   Follow a regular exercise program to stay fit. This will help you maintain your balance. Ask your health care provider what types of exercise are appropriate for you.  If you need a cane or walker, use it as recommended by  your health care provider.  Wear supportive shoes that have nonskid soles. Lifestyle  Do not drink alcohol if your health care provider tells you not to drink.  If you drink alcohol, limit how much you have: ? 0-1 drink a day for women. ? 0-2 drinks a day for men.  Be aware of how much alcohol is in your drink. In the [place] one drink equals one typical bottle of beer (12 oz), one-half glass of wine (5 oz), or one shot of hard liquor (1 oz).  Do not use any products that contain nicotine or tobacco, such as cigarettes and e-cigarettes. If you need help quitting, ask your health care provider. Summary  Having a healthy lifestyle and getting preventive care can help to protect your health and wellness after age 45.  Screening and testing are the best way to find a health problem early and help you avoid having a fall. Early diagnosis and treatment give you the best chance for managing medical conditions that are more common for people who are older than age 31.  Falls are a major cause of broken bones and head injuries in people who are older than age 47. Take precautions to prevent a fall at home.  Work with your health care provider to learn what changes you can make to improve your health and wellness and to prevent falls. This information is not intended to replace advice given to you by your health care provider. Make sure you discuss any questions you have with your health care provider. Document Revised: 12/30/2018 Document Reviewed: 07/22/2017 Elsevier Patient Education  2020 [employer].

## 2020-04-27 NOTE — Assessment & Plan Note (Addendum)
Chronic, mild. Consider periph smear if not previously done.

## 2020-04-27 NOTE — Assessment & Plan Note (Signed)
Appreciate cards care. 

## 2020-04-27 NOTE — Progress Notes (Signed)
This visit was conducted in person.  BP 120/70 (BP Location: Left Arm, Patient Position: Sitting, Cuff Size: Normal)   Pulse 74   Temp 97.7 F (36.5 C) (Temporal)   Ht '5\' 8"'  (1.727 m)   Wt 179 lb 4 oz (81.3 kg)   SpO2 98%   BMI 27.25 kg/m    CC: CPE Subjective:    Patient ID: Joseph Shea, male    DOB: 09-23-1950, 69 y.o.   MRN: [id number]  HPI: Joseph Shea is a 69 y.o. male presenting on 04/27/2020 for Annual Exam (Prt 2. )   Fully retired - considering return to work part time  [employer] this week for [employer] visit. Note reviewed.   No exam data present    Clinical Support from 04/25/2020 in [place] at [facility] Total Score 0      Fall Risk  04/25/2020 04/25/2019  Falls in the past year? 0 0  Number falls in past yr: 0 -  Injury with Fall? 0 -  Risk for fall due to : Medication side effect -  Follow up Falls evaluation completed;Falls prevention discussed -    Preventative: Colon cancer screening - would like stool kitthis year, last done 2018 Prostate cancer screening - nocturia 2x. Wants continued testing. Lung cancer screening - not eligible  Flu shot yearly Tdap 2017 [place] x2 10/2019  Pneumovax - 04/2019 shingrix - discussed - to check with insurance Advanced directives - has packet at home. Would is HCPOA. asked to bring [place] a copy Seat belt use discussed  Sunscreen use discussed. No changing moles on skin.  Non smoker -wife smokes outside  Alcohol - none Dentist - yearly  Eye exam - yearly ([facility])  Bowel - no constipation  Bladder - no incontinence   Lives with wife.  Occupation: Truck Geophysicist/field seismologist, [employer] architect Activity: rides bike 2-3 x/wk a few miles, tries to walk Diet: fruits/vegetables daily, small amt water     Relevant past medical, surgical, family and social history reviewed and updated as indicated. Interim medical history since our last visit reviewed. Allergies and  medications reviewed and updated. Outpatient Medications Prior to Visit  Medication Sig Dispense Refill  . aspirin 81 MG tablet Take 1 tablet (81 mg total) by mouth daily. 30 tablet   . metoprolol tartrate (LOPRESSOR) 25 MG tablet TAKE 1 TABLET BY MOUTH TWICE A DAY 180 tablet 2  . Multiple Vitamin (MULTIVITAMIN PO) Take 1 tablet by mouth daily.      . nitroGLYCERIN (NITROSTAT) 0.4 MG SL tablet Place 1 tablet (0.4 mg total) under the tongue every 5 (five) minutes as needed. Reported on 10/16/2015 30 tablet 1  . rosuvastatin (CRESTOR) 20 MG tablet TAKE 1 TABLET BY MOUTH EVERY DAY 90 tablet 1  . lisinopril (ZESTRIL) 40 MG tablet TAKE 1 TABLET BY MOUTH EVERY DAY 90 tablet 1   No facility-administered medications prior to visit.     Per HPI unless specifically indicated in ROS section below Review of Systems  Constitutional: Negative for activity change, appetite change, chills, fatigue, fever and unexpected weight change.  HENT: Negative for hearing loss.   Eyes: Negative for visual disturbance.  Respiratory: Negative for cough, chest tightness, shortness of breath and wheezing.   Cardiovascular: Negative for chest pain, palpitations and leg swelling.  Gastrointestinal: Negative for abdominal distention, abdominal pain, blood in stool, constipation, diarrhea, nausea and vomiting.  Genitourinary: Negative for difficulty urinating and hematuria.  Musculoskeletal: Negative for arthralgias, myalgias and neck pain.  Skin: Negative for rash.  Neurological: Negative for dizziness, seizures, syncope and headaches.  Hematological: Negative for adenopathy. Does not bruise/bleed easily.  Psychiatric/Behavioral: Negative for dysphoric mood. The patient is not nervous/anxious.    Objective:  BP 120/70 (BP Location: Left Arm, Patient Position: Sitting, Cuff Size: Normal)   Pulse 74   Temp 97.7 F (36.5 C) (Temporal)   Ht '5\' 8"'  (1.727 m)   Wt 179 lb 4 oz (81.3 kg)   SpO2 98%   BMI 27.25 kg/m   Wt  Readings from Last 3 Encounters:  04/27/20 179 lb 4 oz (81.3 kg)  04/03/20 181 lb (82.1 kg)  04/25/19 177 lb 4 oz (80.4 kg)      Physical Exam Vitals and nursing note reviewed.  Constitutional:      General: He is not in acute distress.    Appearance: Normal appearance. He is well-developed. He is not ill-appearing.  HENT:     Head: Normocephalic and atraumatic.     Right Ear: Hearing, tympanic membrane, ear canal and external ear normal.     Left Ear: Hearing, tympanic membrane, ear canal and external ear normal.  Eyes:     General: No scleral icterus.    Extraocular Movements: Extraocular movements intact.     Conjunctiva/sclera: Conjunctivae normal.     Pupils: Pupils are equal, round, and reactive to light.  Neck:     Thyroid: Thyroid mass (?R nodule) present. No thyromegaly or thyroid tenderness.     Vascular: No carotid bruit.  Cardiovascular:     Rate and Rhythm: Normal rate and regular rhythm.     Pulses: Normal pulses.          Radial pulses are 2+ on the right side and 2+ on the left side.     Heart sounds: Normal heart sounds. No murmur heard.   Pulmonary:     Effort: Pulmonary effort is normal. No respiratory distress.     Breath sounds: Normal breath sounds. No wheezing, rhonchi or rales.  Abdominal:     General: Abdomen is flat. Bowel sounds are normal. There is no distension.     Palpations: Abdomen is soft. There is no mass.     Tenderness: There is no abdominal tenderness. There is no guarding or rebound.     Hernia: No hernia is present.  Musculoskeletal:        General: Normal range of motion.     Cervical back: Normal range of motion and neck supple.     Right lower leg: No edema.     Left lower leg: No edema.  Lymphadenopathy:     Cervical: No cervical adenopathy.  Skin:    General: Skin is warm and dry.     Findings: No rash.  Neurological:     General: No focal deficit present.     Mental Status: He is alert and oriented to person, place, and  time.     Comments: CN grossly intact, station and gait intact  Psychiatric:        Mood and Affect: Mood normal.        Behavior: Behavior normal.        Thought Content: Thought content normal.        Judgment: Judgment normal.       Results for orders placed or performed in visit on 04/25/20  CBC with Differential/Platelet  Result Value Ref Range   WBC 7.0 4.0 - 10.5  K/uL   RBC 4.95 4.22 - 5.81 Mil/uL   Hemoglobin 16.2 13.0 - 17.0 g/dL   HCT 46.1 39 - 52 %   MCV 93.0 78.0 - 100.0 fl   MCHC 35.2 30.0 - 36.0 g/dL   RDW 13.4 11.5 - 15.5 %   Platelets 117.0 (L) 150 - 400 K/uL   Neutrophils Relative % 72.2 43 - 77 %   Lymphocytes Relative 18.2 12 - 46 %   Monocytes Relative 8.4 3 - 12 %   Eosinophils Relative 0.7 0 - 5 %   Basophils Relative 0.5 0 - 3 %   Neutro Abs 5.0 1.4 - 7.7 K/uL   Lymphs Abs 1.3 0.7 - 4.0 K/uL   Monocytes Absolute 0.6 0 - 1 K/uL   Eosinophils Absolute 0.1 0 - 0 K/uL   Basophils Absolute 0.0 0 - 0 K/uL  PSA  Result Value Ref Range   PSA 0.44 0.10 - 4.00 ng/mL  Hemoglobin A1c  Result Value Ref Range   Hgb A1c MFr Bld 5.8 4.6 - 6.5 %  Lipid panel  Result Value Ref Range   Cholesterol 129 0 - 200 mg/dL   Triglycerides 107.0 0 - 149 mg/dL   HDL 52.10 >39.00 mg/dL   VLDL 21.4 0.0 - 40.0 mg/dL   LDL Cholesterol 56 0 - 99 mg/dL   Total CHOL/HDL Ratio 2    NonHDL 77.14   Comprehensive metabolic panel  Result Value Ref Range   Sodium 136 135 - 145 mEq/L   Potassium 4.9 3.5 - 5.1 mEq/L   Chloride 103 96 - 112 mEq/L   CO2 29 19 - 32 mEq/L   Glucose, Bld 160 (H) 70 - 99 mg/dL   BUN 17 6 - 23 mg/dL   Creatinine, Ser 0.91 0.40 - 1.50 mg/dL   Total Bilirubin 1.5 (H) 0.2 - 1.2 mg/dL   Alkaline Phosphatase 80 39 - 117 U/L   AST 22 0 - 37 U/L   ALT 36 0 - 53 U/L   Total Protein 6.7 6.0 - 8.3 g/dL   Albumin 4.8 3.5 - 5.2 g/dL   GFR 83.05 >60.00 mL/min   Calcium 9.6 8.4 - 10.5 mg/dL   Assessment & Plan:  This visit occurred during the SARS-CoV-2 public  health emergency.  Safety protocols were in place, including screening questions prior to the visit, additional usage of staff PPE, and extensive cleaning of exam room while observing appropriate contact time as indicated for disinfecting solutions.   Problem List Items Addressed This Visit    Thyroid nodule    Check [place]      Relevant Orders   US THYROID   Thrombocytopenia (HCC)    Chronic, mild. Consider periph smear if not previously done.       Prediabetes    Encouraged limiting added sugars in diet.       Mixed hyperlipidemia    Chronic, stable. Continue current regimen.  The ASCVD Risk score Mikey Bussing DC Jr., et al., 2013) failed to calculate for the following reasons:   The valid total cholesterol range is 130 to 320 mg/dL       Relevant Medications   lisinopril (ZESTRIL) 40 MG tablet   HYPERTENSION, BENIGN ESSENTIAL    Chronic, stable. Continue current regimen.       Relevant Medications   lisinopril (ZESTRIL) 40 MG tablet   Healthcare maintenance - Primary    Preventative protocols reviewed and updated unless pt declined. Discussed healthy diet and lifestyle.  Coronary atherosclerosis    Appreciate cards care.       Relevant Medications   lisinopril (ZESTRIL) 40 MG tablet   Advanced care planning/counseling discussion    Advanced directives - has packet at home. Would is HCPOA. asked to bring [place] a copy.        Other Visit Diagnoses    Special screening for malignant neoplasms, colon       Relevant Orders   Fecal occult blood, imunochemical       Meds ordered this encounter  Medications  . lisinopril (ZESTRIL) 40 MG tablet    Sig: Take 1 tablet (40 mg total) by mouth daily.    Dispense:  90 tablet    Refill:  3   Orders Placed This Encounter  Procedures  . Fecal occult blood, imunochemical    Standing Status:   Future    Standing Expiration Date:   04/27/2021  . US THYROID    Standing Status:   Future    Standing Expiration Date:   04/27/2021     Order Specific Question:   Reason for Exam (SYMPTOM  OR DIAGNOSIS REQUIRED)    Answer:   possible R thyroid nodule    Order Specific Question:   Preferred imaging location?    Answer:   [facility]    Patient instructions: If interested, check with pharmacy about new 2 shot shingles series (shingrix).  Bring [place] a copy of your advanced directives.  Sugar was a bit elevated - watch added sugars in the diet.  You are doing well today! Continue current regimen.  Return as needed or in 1 year for next physical/wellness visit  We will call you to schedule thyroid ultrasound for possible nodule  Follow up plan: Return in about 1 year (around 04/27/2021) for annual exam, prior fasting for blood work, medicare wellness visit.  Ria Bush, MD

## 2020-04-27 NOTE — Assessment & Plan Note (Signed)
Encouraged limiting added sugars in diet.  

## 2020-04-27 NOTE — Assessment & Plan Note (Signed)
Check US 

## 2020-04-27 NOTE — Assessment & Plan Note (Signed)
Preventative protocols reviewed and updated unless pt declined. Discussed healthy diet and lifestyle.  

## 2020-04-27 NOTE — Assessment & Plan Note (Signed)
Chronic, stable. Continue current regimen. 

## 2020-04-27 NOTE — Assessment & Plan Note (Signed)
Chronic, stable. Continue current regimen. The ASCVD Risk score (Goff DC Jr., et al., 2013) failed to calculate for the following reasons:   The valid total cholesterol range is 130 to 320 mg/dL  

## 2020-04-27 NOTE — Assessment & Plan Note (Addendum)
Advanced directives - has packet at home. Would is HCPOA. asked to bring [place] a copy.

## 2020-05-02 ENCOUNTER — Other Ambulatory Visit (INDEPENDENT_AMBULATORY_CARE_PROVIDER_SITE_OTHER): Payer: Medicare HMO

## 2020-05-02 DIAGNOSIS — Z1211 Encounter for screening for malignant neoplasm of colon: Secondary | ICD-10-CM

## 2020-05-02 LAB — FECAL OCCULT BLOOD, IMMUNOCHEMICAL: Fecal Occult Bld: NEGATIVE

## 2020-05-02 LAB — FECAL OCCULT BLOOD, GUAIAC: Fecal Occult Blood: NEGATIVE

## 2020-05-03 ENCOUNTER — Ambulatory Visit
Admission: RE | Admit: 2020-05-03 | Discharge: 2020-05-03 | Disposition: A | Discharge status: Home / Self Care | Payer: Medicare HMO | Source: Ambulatory Visit | Attending: Family Medicine | Admitting: Family Medicine

## 2020-05-03 ENCOUNTER — Other Ambulatory Visit: Payer: Self-pay

## 2020-05-03 ENCOUNTER — Encounter: Payer: Self-pay | Admitting: Family Medicine

## 2020-05-03 DIAGNOSIS — E041 Nontoxic single thyroid nodule: Secondary | ICD-10-CM

## 2020-05-07 ENCOUNTER — Telehealth: Payer: Self-pay

## 2020-05-07 NOTE — Telephone Encounter (Signed)
Pt returning call.  Relayed results.  Pt verbalizes understanding.

## 2020-05-07 NOTE — Telephone Encounter (Signed)
Lvm asking pt to call back.  Need to relay results.  Thyroid US: Your ultrasound returned reassuringly normal - no signs of thyroid nodules

## 2020-05-15 DIAGNOSIS — H524 Presbyopia: Secondary | ICD-10-CM | POA: Diagnosis not present

## 2020-05-16 DIAGNOSIS — Z0100 Encounter for examination of eyes and vision without abnormal findings: Secondary | ICD-10-CM | POA: Diagnosis not present

## 2020-07-17 ENCOUNTER — Telehealth: Payer: Self-pay | Admitting: Cardiovascular Disease

## 2020-07-17 NOTE — Telephone Encounter (Signed)
Patient is calling in due to an issue with unemployment insurance for the past year. Patient is requesting a letter stating that with his diagnosis and heart conditions that contracting covid could be very detrimental and dangerous for his health. Patient left his job and went on unemployment due to covid in January 2021. Patient now received a letter stating he is ineligible now and that he needs to repay everything back unless he can prove that he was a high risk case.  Patient has a hearing next Tuesday where he has to present his case as well as a letter from his cardiologist.

## 2020-07-17 NOTE — Telephone Encounter (Signed)
Spoke with patient and reviewed that he would need to check with his primary care provider to get assistance with this. He states that his wife has numerous health risks which he cares for as well. Advised that he would need to check with her provider is that is his primary reason for staying home to provide her care and reduce her risks of exposure. He verbalized understanding of our conversation and will reach out to his primary provider to see if they could assist with letter for his upcoming case. No further questions at this time.

## 2020-07-18 ENCOUNTER — Other Ambulatory Visit: Payer: Self-pay | Admitting: Family Medicine

## 2020-07-19 ENCOUNTER — Telehealth: Payer: Self-pay

## 2020-07-19 NOTE — Telephone Encounter (Signed)
Ok to schedule transfer of care appt.

## 2020-07-19 NOTE — Telephone Encounter (Signed)
Pt is asking if Dr Reece Agar would be willing to take his wife as a new pt. Her PCP Dr Teola Bradley is no longer in practice.

## 2021-04-19 ENCOUNTER — Other Ambulatory Visit: Payer: Self-pay | Admitting: Family Medicine

## 2021-04-20 LAB — FECAL OCCULT BLOOD, GUAIAC: Fecal Occult Blood: NEGATIVE

## 2021-04-26 ENCOUNTER — Ambulatory Visit (INDEPENDENT_AMBULATORY_CARE_PROVIDER_SITE_OTHER): Payer: Medicare HMO

## 2021-04-26 DIAGNOSIS — Z0000 Encounter for general adult medical examination without abnormal findings: Secondary | ICD-10-CM | POA: Diagnosis not present

## 2021-04-26 NOTE — Patient Instructions (Signed)
Joseph Shea , Thank you for taking time to come for your Medicare Wellness Visit. I appreciate your ongoing commitment to your health goals. Please review the following plan we discussed and let me know if I can assist you in the future.   Screening recommendations/referrals: Colonoscopy: FOBT completed 05/02/2020, due 05/02/2021 Recommended yearly ophthalmology/optometry visit for glaucoma screening and checkup Recommended yearly dental visit for hygiene and checkup  Vaccinations: Influenza vaccine: due Fall 2022  Pneumococcal vaccine: due, will get at upcoming physical Tdap vaccine: Up to date, completed 10/16/2015, due 09/2025 Shingles vaccine: due, check with your insurance regarding coverage if interested    Covid-19: completed 2 vaccines  Advanced directives: Advance directive discussed with you today. Even though you declined this today please call our office should you change your mind and we can give you the proper paperwork for you to fill out.  Conditions/risks identified: hyperlipidemia   Next appointment: Follow up in one year for your annual wellness visit.   Preventive Care 68 Years and Older, Male Preventive care refers to lifestyle choices and visits with your health care provider that can promote health and wellness. What does preventive care include? A yearly physical exam. This is also called an annual well check. Dental exams once or twice a year. Routine eye exams. Ask your health care provider how often you should have your eyes checked. Personal lifestyle choices, including: Daily care of your teeth and gums. Regular physical activity. Eating a healthy diet. Avoiding tobacco and drug use. Limiting alcohol use. Practicing safe sex. Taking low doses of aspirin every day. Taking vitamin and mineral supplements as recommended by your health care provider. What happens during an annual well check? The services and screenings done by your health care provider during  your annual well check will depend on your age, overall health, lifestyle risk factors, and family history of disease. Counseling  Your health care provider may ask you questions about your: Alcohol use. Tobacco use. Drug use. Emotional well-being. Home and relationship well-being. Sexual activity. Eating habits. History of falls. Memory and ability to understand (cognition). Work and work Astronomer. Screening  You may have the following tests or measurements: Height, weight, and BMI. Blood pressure. Lipid and cholesterol levels. These may be checked every 5 years, or more frequently if you are over 59 years old. Skin check. Lung cancer screening. You may have this screening every year starting at age 73 if you have a 30-pack-year history of smoking and currently smoke or have quit within the past 15 years. Fecal occult blood test (FOBT) of the stool. You may have this test every year starting at age 14. Flexible sigmoidoscopy or colonoscopy. You may have a sigmoidoscopy every 5 years or a colonoscopy every 10 years starting at age 91. Prostate cancer screening. Recommendations will vary depending on your family history and other risks. Hepatitis C blood test. Hepatitis B blood test. Sexually transmitted disease (STD) testing. Diabetes screening. This is done by checking your blood sugar (glucose) after you have not eaten for a while (fasting). You may have this done every 1-3 years. Abdominal aortic aneurysm (AAA) screening. You may need this if you are a current or former smoker. Osteoporosis. You may be screened starting at age 55 if you are at high risk. Talk with your health care provider about your test results, treatment options, and if necessary, the need for more tests. Vaccines  Your health care provider may recommend certain vaccines, such as: Influenza vaccine. This is recommended  every year. Tetanus, diphtheria, and acellular pertussis (Tdap, Td) vaccine. You may need  a Td booster every 10 years. Zoster vaccine. You may need this after age 27. Pneumococcal 13-valent conjugate (PCV13) vaccine. One dose is recommended after age 61. Pneumococcal polysaccharide (PPSV23) vaccine. One dose is recommended after age 54. Talk to your health care provider about which screenings and vaccines you need and how often you need them. This information is not intended to replace advice given to you by your health care provider. Make sure you discuss any questions you have with your health care provider. Document Released: 10/05/2015 Document Revised: 05/28/2016 Document Reviewed: 07/10/2015 Elsevier Interactive Patient Education  2017 [employer].  Fall Prevention in the Home Falls can cause injuries. They can happen to people of all ages. There are many things you can do to make your home safe and to help prevent falls. What can I do on the outside of my home? Regularly fix the edges of walkways and driveways and fix any cracks. Remove anything that might make you trip as you walk through a door, such as a raised step or threshold. Trim any bushes or trees on the path to your home. Use bright outdoor lighting. Clear any walking paths of anything that might make someone trip, such as rocks or tools. Regularly check to see if handrails are loose or broken. Make sure that both sides of any steps have handrails. Any raised decks and porches should have guardrails on the edges. Have any leaves, snow, or ice cleared regularly. Use sand or salt on walking paths during winter. Clean up any spills in your garage right away. This includes oil or grease spills. What can I do in the bathroom? Use night lights. Install grab bars by the toilet and in the tub and shower. Do not use towel bars as grab bars. Use non-skid mats or decals in the tub or shower. If you need to sit down in the shower, use a plastic, non-slip stool. Keep the floor dry. Clean up any water that spills on the  floor as soon as it happens. Remove soap buildup in the tub or shower regularly. Attach bath mats securely with double-sided non-slip rug tape. Do not have throw rugs and other things on the floor that can make you trip. What can I do in the bedroom? Use night lights. Make sure that you have a light by your bed that is easy to reach. Do not use any sheets or blankets that are too big for your bed. They should not hang down onto the floor. Have a firm chair that has side arms. You can use this for support while you get dressed. Do not have throw rugs and other things on the floor that can make you trip. What can I do in the kitchen? Clean up any spills right away. Avoid walking on wet floors. Keep items that you use a lot in easy-to-reach places. If you need to reach something above you, use a strong step stool that has a grab bar. Keep electrical cords out of the way. Do not use floor polish or wax that makes floors slippery. If you must use wax, use non-skid floor wax. Do not have throw rugs and other things on the floor that can make you trip. What can I do with my stairs? Do not leave any items on the stairs. Make sure that there are handrails on both sides of the stairs and use them. Fix handrails that are broken  or loose. Make sure that handrails are as long as the stairways. Check any carpeting to make sure that it is firmly attached to the stairs. Fix any carpet that is loose or worn. Avoid having throw rugs at the top or bottom of the stairs. If you do have throw rugs, attach them to the floor with carpet tape. Make sure that you have a light switch at the top of the stairs and the bottom of the stairs. If you do not have them, ask someone to add them for you. What else can I do to help prevent falls? Wear shoes that: Do not have high heels. Have rubber bottoms. Are comfortable and fit you well. Are closed at the toe. Do not wear sandals. If you use a stepladder: Make sure that  it is fully opened. Do not climb a closed stepladder. Make sure that both sides of the stepladder are locked into place. Ask someone to hold it for you, if possible. Clearly mark and make sure that you can see: Any grab bars or handrails. First and last steps. Where the edge of each step is. Use tools that help you move around (mobility aids) if they are needed. These include: Canes. Walkers. Scooters. Crutches. Turn on the lights when you go into a dark area. Replace any light bulbs as soon as they burn out. Set up your furniture so you have a clear path. Avoid moving your furniture around. If any of your floors are uneven, fix them. If there are any pets around you, be aware of where they are. Review your medicines with your doctor. Some medicines can make you feel dizzy. This can increase your chance of falling. Ask your doctor what other things that you can do to help prevent falls. This information is not intended to replace advice given to you by your health care provider. Make sure you discuss any questions you have with your health care provider. Document Released: 07/05/2009 Document Revised: 02/14/2016 Document Reviewed: 10/13/2014 Elsevier Interactive Patient Education  2017 [employer].

## 2021-04-26 NOTE — Progress Notes (Signed)
PCP notes:  Health Maintenance: Prevnar 13- due Shingrix- due    Abnormal Screenings: none   Patient concerns: none   Nurse concerns: none   Next PCP appt.: 05/01/2021 @ 9:30 am
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
## 2021-04-28 ENCOUNTER — Other Ambulatory Visit: Payer: Self-pay | Admitting: Family Medicine

## 2021-04-28 DIAGNOSIS — D696 Thrombocytopenia, unspecified: Secondary | ICD-10-CM

## 2021-04-28 DIAGNOSIS — R7303 Prediabetes: Secondary | ICD-10-CM

## 2021-04-28 DIAGNOSIS — E782 Mixed hyperlipidemia: Secondary | ICD-10-CM

## 2021-04-28 DIAGNOSIS — N400 Enlarged prostate without lower urinary tract symptoms: Secondary | ICD-10-CM

## 2021-04-29 ENCOUNTER — Other Ambulatory Visit: Payer: Self-pay

## 2021-04-29 ENCOUNTER — Ambulatory Visit: Payer: Medicare HMO

## 2021-04-29 ENCOUNTER — Other Ambulatory Visit (INDEPENDENT_AMBULATORY_CARE_PROVIDER_SITE_OTHER): Payer: Medicare HMO

## 2021-04-29 DIAGNOSIS — D696 Thrombocytopenia, unspecified: Secondary | ICD-10-CM

## 2021-04-29 DIAGNOSIS — N400 Enlarged prostate without lower urinary tract symptoms: Secondary | ICD-10-CM

## 2021-04-29 DIAGNOSIS — R7303 Prediabetes: Secondary | ICD-10-CM

## 2021-04-29 DIAGNOSIS — E782 Mixed hyperlipidemia: Secondary | ICD-10-CM

## 2021-04-29 LAB — LIPID PANEL
Cholesterol: 150 mg/dL (ref 0–200)
HDL: 47.7 mg/dL (ref >39.00)
LDL Cholesterol: 67 mg/dL (ref 0–99)
NonHDL: 101.82
Total CHOL/HDL Ratio: 3
Triglycerides: 175 mg/dL — ABNORMAL HIGH (ref 0.0–149.0)
VLDL: 35 mg/dL (ref 0.0–40.0)

## 2021-04-29 LAB — COMPREHENSIVE METABOLIC PANEL WITH GFR
ALT: 37 U/L (ref 0–53)
AST: 17 U/L (ref 0–37)
Albumin: 4.9 g/dL (ref 3.5–5.2)
Alkaline Phosphatase: 81 U/L (ref 39–117)
BUN: 21 mg/dL (ref 6–23)
CO2: 26 meq/L (ref 19–32)
Calcium: 9.6 mg/dL (ref 8.4–10.5)
Chloride: 97 meq/L (ref 96–112)
Creatinine, Ser: 1.06 mg/dL (ref 0.40–1.50)
GFR: 72.26 mL/min (ref >60.00)
Glucose, Bld: 296 mg/dL — ABNORMAL HIGH (ref 70–99)
Potassium: 4.8 meq/L (ref 3.5–5.1)
Sodium: 134 meq/L — ABNORMAL LOW (ref 135–145)
Total Bilirubin: 1.8 mg/dL — ABNORMAL HIGH (ref 0.2–1.2)
Total Protein: 7 g/dL (ref 6.0–8.3)

## 2021-04-29 LAB — CBC WITH DIFFERENTIAL/PLATELET
Basophils Absolute: 0 10*3/uL (ref 0.0–0.1)
Basophils Relative: 0.3 % (ref 0.0–3.0)
Eosinophils Absolute: 0 10*3/uL (ref 0.0–0.7)
Eosinophils Relative: 0.7 % (ref 0.0–5.0)
HCT: 47.5 % (ref 39.0–52.0)
Hemoglobin: 16.7 g/dL (ref 13.0–17.0)
Lymphocytes Relative: 19.7 % (ref 12.0–46.0)
Lymphs Abs: 1 10*3/uL (ref 0.7–4.0)
MCHC: 35.2 g/dL (ref 30.0–36.0)
MCV: 91.6 fl (ref 78.0–100.0)
Monocytes Absolute: 0.5 10*3/uL (ref 0.1–1.0)
Monocytes Relative: 9.3 % (ref 3.0–12.0)
Neutro Abs: 3.4 10*3/uL (ref 1.4–7.7)
Neutrophils Relative %: 70 % (ref 43.0–77.0)
Platelets: 114 10*3/uL — ABNORMAL LOW (ref 150.0–400.0)
RBC: 5.19 Mil/uL (ref 4.22–5.81)
RDW: 13.6 % (ref 11.5–15.5)
WBC: 4.9 10*3/uL (ref 4.0–10.5)

## 2021-04-29 LAB — PSA: PSA: 0.49 ng/mL (ref 0.10–4.00)

## 2021-04-29 LAB — HEMOGLOBIN A1C: Hgb A1c MFr Bld: 7.7 % — ABNORMAL HIGH (ref 4.6–6.5)

## 2021-04-30 ENCOUNTER — Encounter: Payer: Self-pay | Admitting: Family Medicine

## 2021-04-30 ENCOUNTER — Telehealth: Payer: Self-pay

## 2021-04-30 NOTE — Telephone Encounter (Signed)
Pt returning call.  I relayed results and notified pt to rpt test in 1 yr.  Pt verbalizes understanding.

## 2021-04-30 NOTE — Telephone Encounter (Signed)
Received mailed negative iFOB results from [facility].  Updated pt's chart.    Lvm asking pt to call back.  Need to relay results above and notify pt test is to be repeated in 1 yr.

## 2021-05-01 ENCOUNTER — Ambulatory Visit (INDEPENDENT_AMBULATORY_CARE_PROVIDER_SITE_OTHER): Payer: Medicare HMO | Admitting: Family Medicine

## 2021-05-01 ENCOUNTER — Other Ambulatory Visit: Payer: Self-pay

## 2021-05-01 ENCOUNTER — Encounter: Payer: Self-pay | Admitting: Family Medicine

## 2021-05-01 VITALS — BP 120/78 | HR 65 | Temp 97.7°F | Ht 68.0 in | Wt 178.3 lb

## 2021-05-01 DIAGNOSIS — E782 Mixed hyperlipidemia: Secondary | ICD-10-CM | POA: Diagnosis not present

## 2021-05-01 DIAGNOSIS — Z23 Encounter for immunization: Secondary | ICD-10-CM

## 2021-05-01 DIAGNOSIS — I10 Essential (primary) hypertension: Secondary | ICD-10-CM

## 2021-05-01 DIAGNOSIS — I2510 Atherosclerotic heart disease of native coronary artery without angina pectoris: Secondary | ICD-10-CM | POA: Diagnosis not present

## 2021-05-01 DIAGNOSIS — D696 Thrombocytopenia, unspecified: Secondary | ICD-10-CM

## 2021-05-01 DIAGNOSIS — IMO0002 Reserved for concepts with insufficient information to code with codable children: Secondary | ICD-10-CM

## 2021-05-01 DIAGNOSIS — Z7189 Other specified counseling: Secondary | ICD-10-CM

## 2021-05-01 DIAGNOSIS — N400 Enlarged prostate without lower urinary tract symptoms: Secondary | ICD-10-CM | POA: Diagnosis not present

## 2021-05-01 DIAGNOSIS — E118 Type 2 diabetes mellitus with unspecified complications: Secondary | ICD-10-CM

## 2021-05-01 DIAGNOSIS — E1165 Type 2 diabetes mellitus with hyperglycemia: Secondary | ICD-10-CM

## 2021-05-01 DIAGNOSIS — Z0000 Encounter for general adult medical examination without abnormal findings: Secondary | ICD-10-CM | POA: Diagnosis not present

## 2021-05-01 MED ORDER — METOPROLOL TARTRATE 25 MG PO TABS: 25.0000 mg | ORAL_TABLET | Freq: Two times a day (BID) | ORAL | 3 refills | Status: DC

## 2021-05-01 MED ORDER — LISINOPRIL 40 MG PO TABS: 40.0000 mg | ORAL_TABLET | Freq: Every day | ORAL | 3 refills | Status: DC

## 2021-05-01 MED ORDER — ROSUVASTATIN CALCIUM 20 MG PO TABS: 20.0000 mg | ORAL_TABLET | Freq: Every day | ORAL | 3 refills | Status: DC

## 2021-05-01 MED ORDER — NITROGLYCERIN 0.4 MG SL SUBL: 0.4000 mg | SUBLINGUAL_TABLET | SUBLINGUAL | 1 refills | Status: AC | PRN

## 2021-05-01 NOTE — Assessment & Plan Note (Signed)
Preventative protocols reviewed and updated unless pt declined. Discussed healthy diet and lifestyle.  

## 2021-05-01 NOTE — Assessment & Plan Note (Signed)
Reviewed progression to diabetes. He attributes to dietary liberties over this summer.  rec medication - he declines.  He will work on [employer] and return in 3 months for reassessment of diabetic control.  Offered diabetes education referral - he wants to readdress in 3 months.

## 2021-05-01 NOTE — Progress Notes (Signed)
Patient ID: Joseph Shea, male    DOB: March 01, 1951, 70 y.o.   MRN: [id number]  This visit was conducted in person.  BP 120/78   Pulse 65   Temp 97.7 F (36.5 C) (Temporal)   Ht _0  (1.727 m)   Wt 178 lb 5 oz (80.9 kg)   SpO2 97%   BMI 27.11 kg/m    CC: CPE Subjective:   HPI: Joseph Shea is a 70 y.o. male presenting on 05/01/2021 for Annual Exam (Prt 2. )   Saw health advisor last week for medicare wellness visit. Note reviewed.   No results found.  Flowsheet Row Clinical Support from 04/26/2021 in [facility]  PHQ-2 Total Score 0       Fall Risk  04/26/2021 04/25/2020 04/25/2019  Falls in the past year? 0 0 0  Number falls in past yr: 0 0 -  Injury with Fall? 0 0 -  Risk for fall due to : Medication side effect Medication side effect -  Follow up Falls evaluation completed;Falls prevention discussed Falls evaluation completed;Falls prevention discussed -    New grand daughter.   Preventative: Colon cancer screening - yearly stool kit, negative 03/2021 Prostate cancer screening - nocturia 2x. Continue yearly screen.  Lung cancer screening - not eligible  Flu shot yearly  Tdap 2017  COVID vaccine - [place] x2 10/2019  Pneumovax-23 - 04/2019, [id number] today Shingrix - discussed - to check with insurance  Advanced directives - has packet at home, working on this. Wife is HCPOA. asked to bring [place] a copy  Seat belt use discussed Sunscreen use discussed. No changing moles on skin.  Non smoker - wife smokes outside  Alcohol - none  Dentist - yearly  Eye exam - yearly ([facility])  Bowel - no constipation  Bladder - no incontinence   Lives with wife. Occupation: Truck Geophysicist/field seismologist, Naval architect  Activity: rides bike 2-3 x/wk a few miles, tries to walk Diet: fruits/vegetables daily, small amt water     Relevant past medical, surgical, family and social history reviewed and updated as indicated. Interim medical history since our last visit  reviewed. Allergies and medications reviewed and updated. Outpatient Medications Prior to Visit  Medication Sig Dispense Refill   aspirin 81 MG tablet Take 1 tablet (81 mg total) by mouth daily. 30 tablet    Multiple Vitamin (MULTIVITAMIN PO) Take 1 tablet by mouth daily.       lisinopril (ZESTRIL) 40 MG tablet Take 1 tablet (40 mg total) by mouth daily. 90 tablet 3   metoprolol tartrate (LOPRESSOR) 25 MG tablet TAKE 1 TABLET BY MOUTH TWICE A DAY 180 tablet 2   nitroGLYCERIN (NITROSTAT) 0.4 MG SL tablet Place 1 tablet (0.4 mg total) under the tongue every 5 (five) minutes as needed. Reported on 10/16/2015 30 tablet 1   rosuvastatin (CRESTOR) 20 MG tablet TAKE 1 TABLET BY MOUTH EVERY DAY 90 tablet 0   No facility-administered medications prior to visit.     Per HPI unless specifically indicated in ROS section below Review of Systems  Constitutional:  Negative for activity change, appetite change, chills, fatigue, fever and unexpected weight change.  HENT:  Negative for hearing loss.   Eyes:  Negative for visual disturbance.  Respiratory:  Negative for cough, chest tightness, shortness of breath and wheezing.   Cardiovascular:  Negative for chest pain, palpitations and leg swelling.  Gastrointestinal:  Negative for abdominal distention, abdominal pain, blood in stool, constipation,  diarrhea, nausea and vomiting.  Genitourinary:  Negative for difficulty urinating and hematuria.  Musculoskeletal:  Negative for arthralgias, myalgias and neck pain.  Skin:  Negative for rash.  Neurological:  Negative for dizziness, seizures, syncope and headaches.  Hematological:  Negative for adenopathy. Does not bruise/bleed easily.  Psychiatric/Behavioral:  Negative for dysphoric mood. The patient is not nervous/anxious.    Objective:  BP 120/78   Pulse 65   Temp 97.7 F (36.5 C) (Temporal)   Ht _0  (1.727 m)   Wt 178 lb 5 oz (80.9 kg)   SpO2 97%   BMI 27.11 kg/m   Wt Readings from Last 3  Encounters:  05/01/21 178 lb 5 oz (80.9 kg)  04/27/20 179 lb 4 oz (81.3 kg)  04/03/20 181 lb (82.1 kg)      Physical Exam Vitals and nursing note reviewed.  Constitutional:      General: He is not in acute distress.    Appearance: Normal appearance. He is well-developed. He is not ill-appearing.  HENT:     Head: Normocephalic and atraumatic.     Right Ear: Hearing, tympanic membrane, ear canal and external ear normal.     Left Ear: Hearing, tympanic membrane, ear canal and external ear normal.  Eyes:     General: No scleral icterus.    Extraocular Movements: Extraocular movements intact.     Conjunctiva/sclera: Conjunctivae normal.     Pupils: Pupils are equal, round, and reactive to light.  Neck:     Thyroid: No thyroid mass or thyromegaly.     Vascular: No carotid bruit.  Cardiovascular:     Rate and Rhythm: Normal rate and regular rhythm.     Pulses: Normal pulses.          Radial pulses are 2+ on the right side and 2+ on the left side.     Heart sounds: Normal heart sounds. No murmur heard. Pulmonary:     Effort: Pulmonary effort is normal. No respiratory distress.     Breath sounds: Normal breath sounds. No wheezing, rhonchi or rales.  Abdominal:     General: Bowel sounds are normal. There is no distension.     Palpations: Abdomen is soft. There is no mass.     Tenderness: There is no abdominal tenderness. There is no guarding or rebound.     Hernia: No hernia is present.  Musculoskeletal:        General: Normal range of motion.     Cervical back: Normal range of motion and neck supple.     Right lower leg: No edema.     Left lower leg: No edema.  Lymphadenopathy:     Cervical: No cervical adenopathy.  Skin:    General: Skin is warm and dry.     Findings: No rash.  Neurological:     General: No focal deficit present.     Mental Status: He is alert and oriented to person, place, and time.  Psychiatric:        Mood and Affect: Mood normal.        Behavior:  Behavior normal.        Thought Content: Thought content normal.        Judgment: Judgment normal.      Results for orders placed or performed in visit on 04/30/21  Fecal Occult Blood, Guaiac  Result Value Ref Range   Fecal Occult Blood Negative     Assessment & Plan:  This visit occurred during the SARS-CoV-2  public health emergency.  Safety protocols were in place, including screening questions prior to the visit, additional usage of staff PPE, and extensive cleaning of exam room while observing appropriate contact time as indicated for disinfecting solutions.   Problem List Items Addressed This Visit     Mixed hyperlipidemia    Chronic, stable on crestor - continue this.  The 10-year ASCVD risk score Mikey Bussing DC Brooke Bonito., et al., 2013) is: 14.3%   Values used to calculate the score:     Age: 23 years     Sex: Male     Is Non-Hispanic African American: No     Diabetic: No     Tobacco smoker: No     Systolic Blood Pressure: 768 mmHg     Is BP treated: Yes     HDL Cholesterol: 47.7 mg/dL     Total Cholesterol: 150 mg/dL        Relevant Medications   rosuvastatin (CRESTOR) 20 MG tablet   nitroGLYCERIN (NITROSTAT) 0.4 MG SL tablet   metoprolol tartrate (LOPRESSOR) 25 MG tablet   lisinopril (ZESTRIL) 40 MG tablet   Thrombocytopenia (HCC)    Chronic ?ITP, aspirin contributes. Consider periph smear if not previously done.        HYPERTENSION, BENIGN ESSENTIAL    Chronic, stable. Continue current regimen.        Relevant Medications   rosuvastatin (CRESTOR) 20 MG tablet   nitroGLYCERIN (NITROSTAT) 0.4 MG SL tablet   metoprolol tartrate (LOPRESSOR) 25 MG tablet   lisinopril (ZESTRIL) 40 MG tablet   Coronary atherosclerosis    S/p stents, sees cardiology.        Relevant Medications   rosuvastatin (CRESTOR) 20 MG tablet   nitroGLYCERIN (NITROSTAT) 0.4 MG SL tablet   metoprolol tartrate (LOPRESSOR) 25 MG tablet   lisinopril (ZESTRIL) 40 MG tablet   Hyperbilirubinemia     Chronic, anticipate gilbert's.        Diabetes mellitus type 2, uncontrolled, with complications ([place])    Reviewed progression to diabetes. He attributes to dietary liberties over this summer.  rec medication - he declines.  He will work on [employer] and return in 3 months for reassessment of diabetic control.  Offered diabetes education referral - he wants to readdress in 3 months.        Relevant Medications   rosuvastatin (CRESTOR) 20 MG tablet   lisinopril (ZESTRIL) 40 MG tablet   Healthcare maintenance - Primary    Preventative protocols reviewed and updated unless pt declined. Discussed healthy diet and lifestyle.        Benign prostatic hyperplasia    H/o this, PSA very stable.        Advanced care planning/counseling discussion    Advanced directives - has packet at home, working on this. Wife is HCPOA. asked to bring [place] a copy        Other Visit Diagnoses     Need for vaccination against Streptococcus pneumoniae       Relevant Orders   Pneumococcal conjugate vaccine 20-valent (Completed)        Meds ordered this encounter  Medications   rosuvastatin (CRESTOR) 20 MG tablet    Sig: Take 1 tablet (20 mg total) by mouth daily.    Dispense:  90 tablet    Refill:  3   nitroGLYCERIN (NITROSTAT) 0.4 MG SL tablet    Sig: Place 1 tablet (0.4 mg total) under the tongue every 5 (five) minutes as needed. Reported on 10/16/2015  Dispense:  30 tablet    Refill:  1   metoprolol tartrate (LOPRESSOR) 25 MG tablet    Sig: Take 1 tablet (25 mg total) by mouth 2 (two) times daily.    Dispense:  180 tablet    Refill:  3   lisinopril (ZESTRIL) 40 MG tablet    Sig: Take 1 tablet (40 mg total) by mouth daily.    Dispense:  90 tablet    Refill:  3   Orders Placed This Encounter  Procedures   Pneumococcal conjugate vaccine 20-valent    Patient instructions: Pneumonia shot today ([id number]).  If interested, check with pharmacy about new 2 shot shingles  series (shingrix).  Sugar was too high - work on low sugar low carb diabetic diet, handout provided today.  Return in 3 months for follow up visit (diabetes).   Follow up plan: Return in about 1 year (around 05/01/2022) for annual exam, prior fasting for blood work, medicare wellness visit.  Ria Bush, MD

## 2021-05-01 NOTE — Assessment & Plan Note (Signed)
Chronic, stable on crestor - continue this.  The 10-year ASCVD risk score Denman George DC Montez Hageman., et al., 2013) is: 14.3%   Values used to calculate the score:     Age: 70 years     Sex: Male     Is Non-Hispanic African American: No     Diabetic: No     Tobacco smoker: No     Systolic Blood Pressure: 120 mmHg     Is BP treated: Yes     HDL Cholesterol: 47.7 mg/dL     Total Cholesterol: 150 mg/dL

## 2021-05-01 NOTE — Patient Instructions (Addendum)
Pneumonia shot today (prevnar-20).  If interested, check with pharmacy about new 2 shot shingles series (shingrix).  Sugar was too high - work on low sugar low carb diabetic diet, handout provided today.  Return in 3 months for follow up visit (diabetes).   Health Maintenance After Age 68 After age 56, you are at a higher risk for certain long-term diseases and infections as well as injuries from falls. Falls are a major cause of broken bones and head injuries in people who are older than age 87. Getting regular preventive care can help to keep you healthy and well. Preventive care includes getting regular testing and making lifestyle changes as recommended by your health care provider. Talk with your health care provider about: Which screenings and tests you should have. A screening is a test that checks for a disease when you have no symptoms. A diet and exercise plan that is right for you. What should I know about screenings and tests to prevent falls? Screening and testing are the best ways to find a health problem early. Early diagnosis and treatment give you the best chance of managing medical conditions that are common after age 64. Certain conditions and lifestyle choices may make you more likely to have a fall. Your health care provider may recommend: Regular vision checks. Poor vision and conditions such as cataracts can make you more likely to have a fall. If you wear glasses, make sure to get your prescription updated if your vision changes. Medicine review. Work with your health care provider to regularly review all of the medicines you are taking, including over-the-counter medicines. Ask your health care provider about any side effects that may make you more likely to have a fall. Tell your health care provider if any medicines that you take make you feel dizzy or sleepy. Osteoporosis screening. Osteoporosis is a condition that causes the bones to get weaker. This can make the bones weak  and cause them to break more easily. Blood pressure screening. Blood pressure changes and medicines to control blood pressure can make you feel dizzy. Strength and balance checks. Your health care provider may recommend certain tests to check your strength and balance while standing, walking, or changing positions. Foot health exam. Foot pain and numbness, as well as not wearing proper footwear, can make you more likely to have a fall. Depression screening. You may be more likely to have a fall if you have a fear of falling, feel emotionally low, or feel unable to do activities that you used to do. Alcohol use screening. Using too much alcohol can affect your balance and may make you more likely to have a fall. What actions can I take to lower my risk of falls? General instructions Talk with your health care provider about your risks for falling. Tell your health care provider if: You fall. Be sure to tell your health care provider about all falls, even ones that seem minor. You feel dizzy, sleepy, or off-balance. Take over-the-counter and prescription medicines only as told by your health care provider. These include any supplements. Eat a healthy diet and maintain a healthy weight. A healthy diet includes low-fat dairy products, low-fat (lean) meats, and fiber from whole grains, beans, and lots of fruits and vegetables. Home safety Remove any tripping hazards, such as rugs, cords, and clutter. Install safety equipment such as grab bars in bathrooms and safety rails on stairs. Keep rooms and walkways well-lit. Activity  Follow a regular exercise program to stay fit.  This will help you maintain your balance. Ask your health care provider what types of exercise are appropriate for you. If you need a cane or walker, use it as recommended by your health care provider. Wear supportive shoes that have nonskid soles.  Lifestyle Do not drink alcohol if your health care provider tells you not to  drink. If you drink alcohol, limit how much you have: 0-1 drink a day for women. 0-2 drinks a day for men. Be aware of how much alcohol is in your drink. In the [place] one drink equals one typical bottle of beer (12 oz), one-half glass of wine (5 oz), or one shot of hard liquor (1 oz). Do not use any products that contain nicotine or tobacco, such as cigarettes and e-cigarettes. If you need help quitting, ask your health care provider. Summary Having a healthy lifestyle and getting preventive care can help to protect your health and wellness after age 68. Screening and testing are the best way to find a health problem early and help you avoid having a fall. Early diagnosis and treatment give you the best chance for managing medical conditions that are more common for people who are older than age 68. Falls are a major cause of broken bones and head injuries in people who are older than age 565. Take precautions to prevent a fall at home. Work with your health care provider to learn what changes you can make to improve your health and wellness and to prevent falls. This information is not intended to replace advice given to you by your health care provider. Make sure you discuss any questions you have with your healthcare provider. Document Revised: 08/24/2020 Document Reviewed: 08/24/2020 Elsevier Patient Education  2022 Else[employer] Inc. Diabetes Mellitus and Nutrition, Adult When you have diabetes, or diabetes mellitus, it is very important to have healthy eating habits because your blood sugar (glucose) levels are greatly affected by what you eat and drink. Eating healthy foods in the right amounts, at about the same times every day, can help you: Control your blood glucose. Lower your risk of heart disease. Improve your blood pressure. Reach or maintain a healthy weight. What can affect my meal plan? Every person with diabetes is different, and each person has different needs for a meal plan.  Your health care provider may recommend that you work with a dietitian to make a meal plan that is best for you. Your meal plan may vary depending on factors such as: The calories you need. The medicines you take. Your weight. Your blood glucose, blood pressure, and cholesterol levels. Your activity level. Other health conditions you have, such as heart or kidney disease. How do carbohydrates affect me? Carbohydrates, also called carbs, affect your blood glucose level more than any other type of food. Eating carbs naturally raises the amount of glucose in your blood. Carb counting is a method for keeping track of how many carbs you eat. Counting carbs is important to keep your blood glucose at a healthy level,especially if you use insulin or take certain oral diabetes medicines. It is important to know how many carbs you can safely have in each meal. This is different for every person. Your dietitian can help you calculate how manycarbs you should have at each meal and for each snack. How does alcohol affect me? Alcohol can cause a sudden decrease in blood glucose (hypoglycemia), especially if you use insulin or take certain oral diabetes medicines. Hypoglycemia can be a life-threatening condition. Symptoms  of hypoglycemia, such as sleepiness, dizziness, and confusion, are similar to symptoms of having too much alcohol. Do not drink alcohol if: Your health care provider tells you not to drink. You are pregnant, may be pregnant, or are planning to become pregnant. If you drink alcohol: Do not drink on an empty stomach. Limit how much you use to: 0-1 drink a day for women. 0-2 drinks a day for men. Be aware of how much alcohol is in your drink. In the [place] one drink equals one 12 oz bottle of beer (355 mL), one 5 oz glass of wine (148 mL), or one 1 oz glass of hard liquor (44 mL). Keep yourself hydrated with water, diet soda, or unsweetened iced tea. Keep in mind that regular soda, juice, and  other mixers may contain a lot of sugar and must be counted as carbs. What are tips for following this plan?  Reading food labels Start by checking the serving size on the "Nutrition Facts" label of packaged foods and drinks. The amount of calories, carbs, fats, and other nutrients listed on the label is based on one serving of the item. Many items contain more than one serving per package. Check the total grams (g) of carbs in one serving. You can calculate the number of servings of carbs in one serving by dividing the total carbs by 15. For example, if a food has 30 g of total carbs per serving, it would be equal to 2 servings of carbs. Check the number of grams (g) of saturated fats and trans fats in one serving. Choose foods that have a low amount or none of these fats. Check the number of milligrams (mg) of salt (sodium) in one serving. Most people should limit total sodium intake to less than 2,300 mg per day. Always check the nutrition information of foods labeled as "low-fat" or "nonfat." These foods may be higher in added sugar or refined carbs and should be avoided. Talk to your dietitian to identify your daily goals for nutrients listed on the label. Shopping Avoid buying canned, pre-made, or processed foods. These foods tend to be high in fat, sodium, and added sugar. Shop around the outside edge of the grocery store. This is where you will most often find fresh fruits and vegetables, bulk grains, fresh meats, and fresh dairy. Cooking Use low-heat cooking methods, such as baking, instead of high-heat cooking methods like deep frying. Cook using healthy oils, such as olive, canola, or sunflower oil. Avoid cooking with butter, cream, or high-fat meats. Meal planning Eat meals and snacks regularly, preferably at the same times every day. Avoid going long periods of time without eating. Eat foods that are high in fiber, such as fresh fruits, vegetables, beans, and whole grains. Talk with  your dietitian about how many servings of carbs you can eat at each meal. Eat 4-6 oz (112-168 g) of lean protein each day, such as lean meat, chicken, fish, eggs, or tofu. One ounce (oz) of lean protein is equal to: 1 oz (28 g) of meat, chicken, or fish. 1 egg.  cup (62 g) of tofu. Eat some foods each day that contain healthy fats, such as avocado, nuts, seeds, and fish. What foods should I eat? Fruits Berries. Apples. Oranges. Peaches. Apricots. Plums. Grapes. Mango. Papaya.Pomegranate. Kiwi. Cherries. Vegetables Lettuce. Spinach. Leafy greens, including kale, chard, collard greens, and mustard greens. Beets. Cauliflower. Cabbage. Broccoli. Carrots. Green beans.Tomatoes. Peppers. Onions. Cucumbers. Brussels sprouts. Grains Whole grains, such as whole-wheat or whole-grain  bread, crackers, tortillas,cereal, and pasta. Unsweetened oatmeal. Quinoa. Brown or wild rice. Meats and other proteins Seafood. Poultry without skin. Lean cuts of poultry and beef. Tofu. Nuts. Seeds. Dairy Low-fat or fat-free dairy products such as milk, yogurt, and cheese. The items listed above may not be a complete list of foods and beverages you can eat. Contact a dietitian for more information. What foods should I avoid? Fruits Fruits canned with syrup. Vegetables Canned vegetables. Frozen vegetables with butter or cream sauce. Grains Refined white flour and flour products such as bread, pasta, snack foods, andcereals. Avoid all processed foods. Meats and other proteins Fatty cuts of meat. Poultry with skin. Breaded or fried meats. Processed meat.Avoid saturated fats. Dairy Full-fat yogurt, cheese, or milk. Beverages Sweetened drinks, such as soda or iced tea. The items listed above may not be a complete list of foods and beverages you should avoid. Contact a dietitian for more information. Questions to ask a health care provider Do I need to meet with a diabetes educator? Do I need to meet with a  dietitian? What number can I call if I have questions? When are the best times to check my blood glucose? Where to find more information: American Diabetes Association: diabetes.org Academy of Nutrition and Dietetics: www.eatright.[employer] of Diabetes and Digestive and Kidney Diseases: CarFlippers.tn Association of Diabetes Care and Education Specialists: www.diabeteseducator.org Summary It is important to have healthy eating habits because your blood sugar (glucose) levels are greatly affected by what you eat and drink. A healthy meal plan will help you control your blood glucose and maintain a healthy lifestyle. Your health care provider may recommend that you work with a dietitian to make a meal plan that is best for you. Keep in mind that carbohydrates (carbs) and alcohol have immediate effects on your blood glucose levels. It is important to count carbs and to use alcohol carefully. This information is not intended to replace advice given to you by your health care provider. Make sure you discuss any questions you have with your healthcare provider. Document Revised: 08/16/2019 Document Reviewed: 08/16/2019 Elsevier Patient Education  2021 [employer].

## 2021-05-01 NOTE — Assessment & Plan Note (Signed)
Chronic, anticipate gilbert's.

## 2021-05-01 NOTE — Assessment & Plan Note (Addendum)
S/p stents, sees cardiology.

## 2021-05-01 NOTE — Assessment & Plan Note (Signed)
H/o this, PSA very stable.

## 2021-05-01 NOTE — Assessment & Plan Note (Signed)
Chronic, stable. Continue current regimen. 

## 2021-05-01 NOTE — Assessment & Plan Note (Signed)
Advanced directives - has packet at home, working on this. Wife is HCPOA. asked to bring [place] a copy

## 2021-05-01 NOTE — Assessment & Plan Note (Signed)
Chronic ?ITP, aspirin contributes. Consider periph smear if not previously done.

## 2021-07-20 ENCOUNTER — Other Ambulatory Visit: Payer: Self-pay | Admitting: Family Medicine

## 2021-07-20 DIAGNOSIS — D696 Thrombocytopenia, unspecified: Secondary | ICD-10-CM

## 2021-07-20 DIAGNOSIS — E1169 Type 2 diabetes mellitus with other specified complication: Secondary | ICD-10-CM

## 2021-07-25 ENCOUNTER — Other Ambulatory Visit (INDEPENDENT_AMBULATORY_CARE_PROVIDER_SITE_OTHER): Payer: Medicare HMO

## 2021-07-25 ENCOUNTER — Other Ambulatory Visit: Payer: Self-pay

## 2021-07-25 DIAGNOSIS — E1169 Type 2 diabetes mellitus with other specified complication: Secondary | ICD-10-CM | POA: Diagnosis not present

## 2021-07-25 DIAGNOSIS — D696 Thrombocytopenia, unspecified: Secondary | ICD-10-CM | POA: Diagnosis not present

## 2021-07-25 LAB — HEMOGLOBIN A1C: Hgb A1c MFr Bld: 6.4 % (ref 4.6–6.5)

## 2021-07-25 NOTE — Addendum Note (Signed)
Addended by: Alvina Chou on: 07/25/2021 08:49 AM   Modules accepted: Orders

## 2021-07-29 LAB — CBC WITH DIFFERENTIAL/PLATELET
Absolute Monocytes: 534 cells/uL (ref 200–950)
Basophils Absolute: 22 cells/uL (ref 0–200)
Basophils Relative: 0.4 %
Eosinophils Absolute: 50 cells/uL (ref 15–500)
Eosinophils Relative: 0.9 %
HCT: 49.6 % (ref 38.5–50.0)
Hemoglobin: 17.4 g/dL — ABNORMAL HIGH (ref 13.2–17.1)
Lymphs Abs: 1034 cells/uL (ref 850–3900)
MCH: 32 pg (ref 27.0–33.0)
MCHC: 35.1 g/dL (ref 32.0–36.0)
MCV: 91.3 fL (ref 80.0–100.0)
MPV: 10.7 fL (ref 7.5–12.5)
Monocytes Relative: 9.7 %
Neutro Abs: 3861 cells/uL (ref 1500–7800)
Neutrophils Relative %: 70.2 %
Platelets: 126 10*3/uL — ABNORMAL LOW (ref 140–400)
RBC: 5.43 10*6/uL (ref 4.20–5.80)
RDW: 12.5 % (ref 11.0–15.0)
Total Lymphocyte: 18.8 %
WBC: 5.5 10*3/uL (ref 3.8–10.8)

## 2021-07-29 LAB — PATHOLOGIST SMEAR REVIEW

## 2021-08-02 ENCOUNTER — Encounter: Payer: Self-pay | Admitting: Family Medicine

## 2021-08-02 ENCOUNTER — Ambulatory Visit (INDEPENDENT_AMBULATORY_CARE_PROVIDER_SITE_OTHER): Payer: Medicare HMO | Admitting: Family Medicine

## 2021-08-02 ENCOUNTER — Other Ambulatory Visit: Payer: Self-pay

## 2021-08-02 VITALS — BP 136/76 | HR 64 | Temp 97.5°F | Ht 68.0 in | Wt 174.4 lb

## 2021-08-02 DIAGNOSIS — E1169 Type 2 diabetes mellitus with other specified complication: Secondary | ICD-10-CM | POA: Diagnosis not present

## 2021-08-02 DIAGNOSIS — D696 Thrombocytopenia, unspecified: Secondary | ICD-10-CM | POA: Diagnosis not present

## 2021-08-02 NOTE — Assessment & Plan Note (Signed)
Congratulated on healthy changes to date.  He has been able to control diabetes with diet and lifestyle changes alone.  Continue this.  Recheck A1c in 4-5 months (lab visit only).

## 2021-08-02 NOTE — Progress Notes (Signed)
Patient ID: Joseph Shea, male    DOB: 06/09/1951, 70 y.o.   MRN: [id number]  This visit was conducted in person.  BP 136/76   Pulse 64   Temp (!) 97.5 F (36.4 C) (Temporal)   Ht 5\' 8"  (1.727 m)   Wt 174 lb 6 oz (79.1 kg)   SpO2 99%   BMI 26.51 kg/m    CC: 3 mo DM f/u visit  Subjective:   HPI: Joseph Shea is a 70 y.o. male presenting on 08/02/2021 for Diabetes (Here for 3 mo f/u.)   Seen 04/2021 with new diagnosis of diabetes based on A1c of 7.7%. declined medication at that time  DM - does not regularly check sugars. Compliant with antihyperglycemic regimen which includes: diet controlled. He's backed off ice cream and started riding bike 8-10 miles/day, walking, weight lifting. Denies hypoglycemic symptoms. Denies paresthesias, blurry vision. Last diabetic eye exam last year. Glucometer brand: doesn't have. Last foot exam: today. DSME: declined.  Lab Results  Component Value Date   HGBA1C 6.4 07/25/2021   Diabetic Foot Exam - Simple   Simple Foot Form Diabetic Foot exam was performed with the following findings: Yes 08/02/2021 10:51 AM  Visual Inspection See comments: Yes Sensation Testing Intact to touch and monofilament testing bilaterally: Yes Pulse Check See comments: Yes Comments Calluses to bilateral soles 1+ DP bilaterally    No results found for: 13/07/2021       Relevant past medical, surgical, family and social history reviewed and updated as indicated. Interim medical history since our last visit reviewed. Allergies and medications reviewed and updated. Outpatient Medications Prior to Visit  Medication Sig Dispense Refill   aspirin 81 MG tablet Take 1 tablet (81 mg total) by mouth daily. 30 tablet    lisinopril (ZESTRIL) 40 MG tablet Take 1 tablet (40 mg total) by mouth daily. 90 tablet 3   metoprolol tartrate (LOPRESSOR) 25 MG tablet Take 1 tablet (25 mg total) by mouth 2 (two) times daily. 180 tablet 3   Multiple Vitamin  (MULTIVITAMIN PO) Take 1 tablet by mouth daily.       nitroGLYCERIN (NITROSTAT) 0.4 MG SL tablet Place 1 tablet (0.4 mg total) under the tongue every 5 (five) minutes as needed. Reported on 10/16/2015 30 tablet 1   rosuvastatin (CRESTOR) 20 MG tablet Take 1 tablet (20 mg total) by mouth daily. 90 tablet 3   No facility-administered medications prior to visit.     Per HPI unless specifically indicated in ROS section below Review of Systems  Objective:  BP 136/76   Pulse 64   Temp (!) 97.5 F (36.4 C) (Temporal)   Ht 5\' 8"  (1.727 m)   Wt 174 lb 6 oz (79.1 kg)   SpO2 99%   BMI 26.51 kg/m   Wt Readings from Last 3 Encounters:  08/02/21 174 lb 6 oz (79.1 kg)  05/01/21 178 lb 5 oz (80.9 kg)  04/27/20 179 lb 4 oz (81.3 kg)      Physical Exam Vitals and nursing note reviewed.  Constitutional:      Appearance: Normal appearance. He is not ill-appearing.  Eyes:     Extraocular Movements: Extraocular movements intact.     Conjunctiva/sclera: Conjunctivae normal.     Pupils: Pupils are equal, round, and reactive to light.  Cardiovascular:     Rate and Rhythm: Normal rate and regular rhythm.     Pulses: Normal pulses.     Heart sounds: Normal heart sounds. No murmur  heard. Pulmonary:     Effort: Pulmonary effort is normal. No respiratory distress.     Breath sounds: Normal breath sounds. No wheezing, rhonchi or rales.  Musculoskeletal:     Right lower leg: No edema.     Left lower leg: No edema.     Comments: See HPI for foot exam if done  Skin:    General: Skin is warm and dry.     Findings: No rash.  Neurological:     Mental Status: He is alert.  Psychiatric:        Mood and Affect: Mood normal.        Behavior: Behavior normal.      Results for orders placed or performed in visit on 07/25/21  CBC with Differential/Platelet  Result Value Ref Range   WBC 5.5 3.8 - 10.8 Thousand/uL   RBC 5.43 4.20 - 5.80 Million/uL   Hemoglobin 17.4 (H) 13.2 - 17.1 g/dL   HCT 55.7 32.2  - 02.5 %   MCV 91.3 80.0 - 100.0 fL   MCH 32.0 27.0 - 33.0 pg   MCHC 35.1 32.0 - 36.0 g/dL   RDW 42.7 06.2 - 37.6 %   Platelets 126 (L) 140 - 400 Thousand/uL   MPV 10.7 7.5 - 12.5 fL   Neutro Abs 3,861 1,500 - 7,800 cells/uL   Lymphs Abs 1,034 850 - 3,900 cells/uL   Absolute Monocytes 534 200 - 950 cells/uL   Eosinophils Absolute 50 15 - 500 cells/uL   Basophils Absolute 22 0 - 200 cells/uL   Neutrophils Relative % 70.2 %   Total Lymphocyte 18.8 %   Monocytes Relative 9.7 %   Eosinophils Relative 0.9 %   Basophils Relative 0.4 %  Hemoglobin A1c  Result Value Ref Range   Hgb A1c MFr Bld 6.4 4.6 - 6.5 %  Pathologist smear review  Result Value Ref Range   Path Review      Assessment & Plan:  This visit occurred during the SARS-CoV-2 public health emergency.  Safety protocols were in place, including screening questions prior to the visit, additional usage of staff PPE, and extensive cleaning of exam room while observing appropriate contact time as indicated for disinfecting solutions.   Problem List Items Addressed This Visit     Thrombocytopenia (HCC)    Chronic, ?ITP, aspirin likely contributing.       Diabetes mellitus (HCC) - Primary    Congratulated on healthy changes to date.  He has been able to control diabetes with diet and lifestyle changes alone.  Continue this.  Recheck A1c in 4-5 months (lab visit only).       Relevant Orders   POCT glycosylated hemoglobin (Hb A1C)     No orders of the defined types were placed in this encounter.  Orders Placed This Encounter  Procedures   POCT glycosylated hemoglobin (Hb A1C)    Standing Status:   Future    Standing Expiration Date:   01/30/2022     Patient Instructions  Good to see you today.  Congratulations on healthy changes and sugar control! Schedule eye exam - tell them about your recent A1c readings.  Schedule lab visit in ~4 months for diabetes check.  Return for physical next August.   Follow up  plan: Return in about 6 months (around 01/30/2022) for follow up visit.  Eustaquio Boyden, MD

## 2021-08-02 NOTE — Assessment & Plan Note (Signed)
Chronic, ?ITP, aspirin likely contributing.

## 2021-08-02 NOTE — Patient Instructions (Addendum)
Good to see you today.  Congratulations on healthy changes and sugar control! Schedule eye exam - tell them about your recent A1c readings.  Schedule lab visit in ~4 months for diabetes check.  Return for physical next August.

## 2021-10-01 DIAGNOSIS — H524 Presbyopia: Secondary | ICD-10-CM | POA: Diagnosis not present

## 2021-10-01 LAB — HM DIABETES EYE EXAM

## 2021-10-11 DIAGNOSIS — Z0100 Encounter for examination of eyes and vision without abnormal findings: Secondary | ICD-10-CM | POA: Diagnosis not present

## 2021-10-19 IMAGING — US US THYROID
1 series · 14 of 25 positions shown · non-contrast
Comparison: None.

CLINICAL DATA: 69-year-old male with a history thyroid nodule

EXAM:
THYROID ULTRASOUND
TECHNIQUE: Ultrasound examination of the thyroid gland and adjacent soft
tissues was performed.

[Series 1: us thyroid · 0.07mm/px · 14 of 37 slices shown]
[im 1/37]
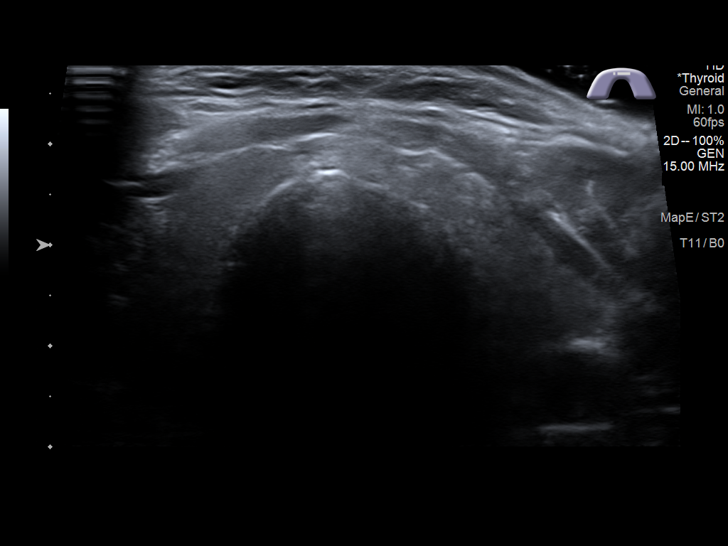
[im 4/37]
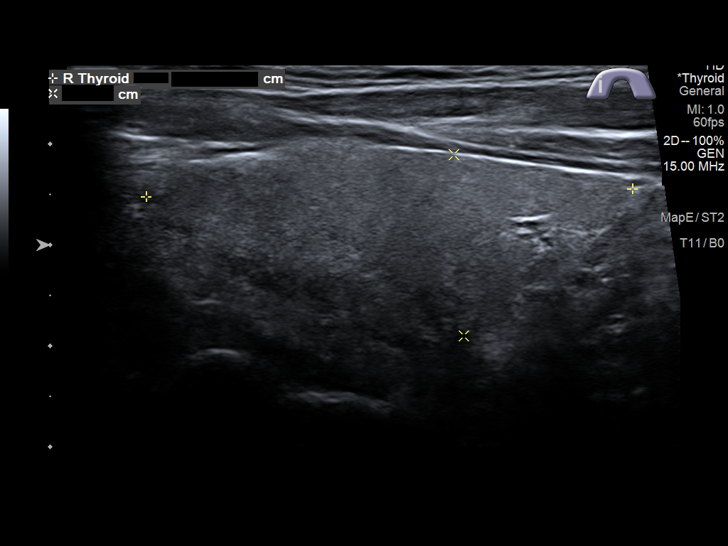
[im 7/37]
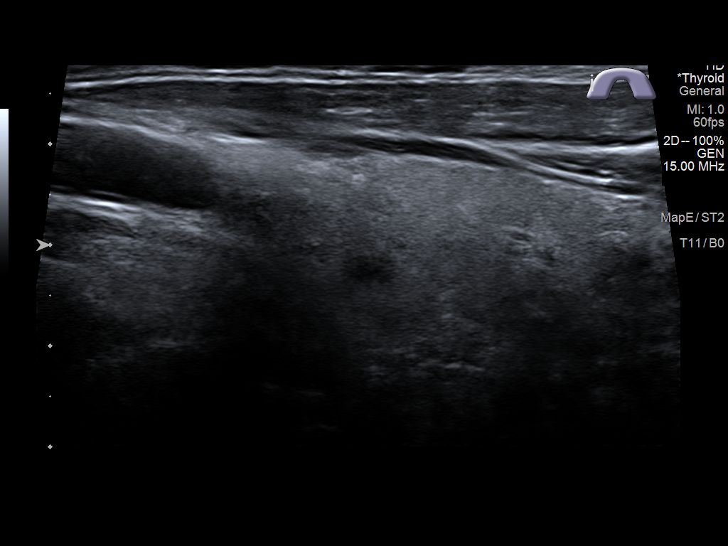
[im 10/37]
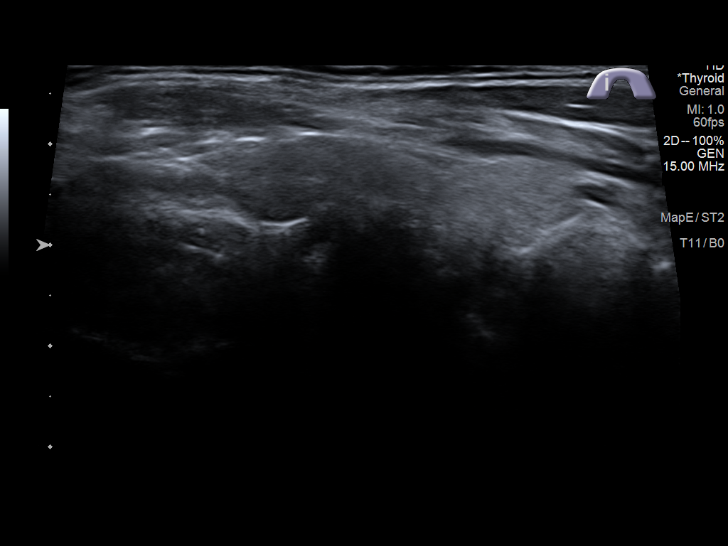
[im 13/37]
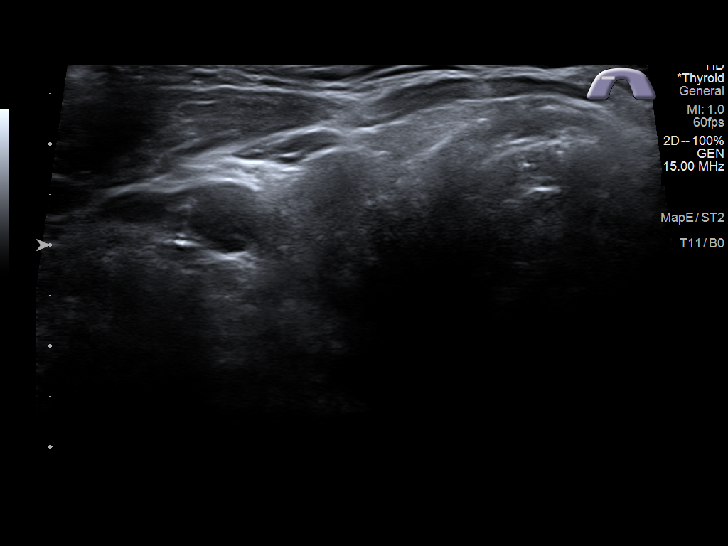
[im 14/37]
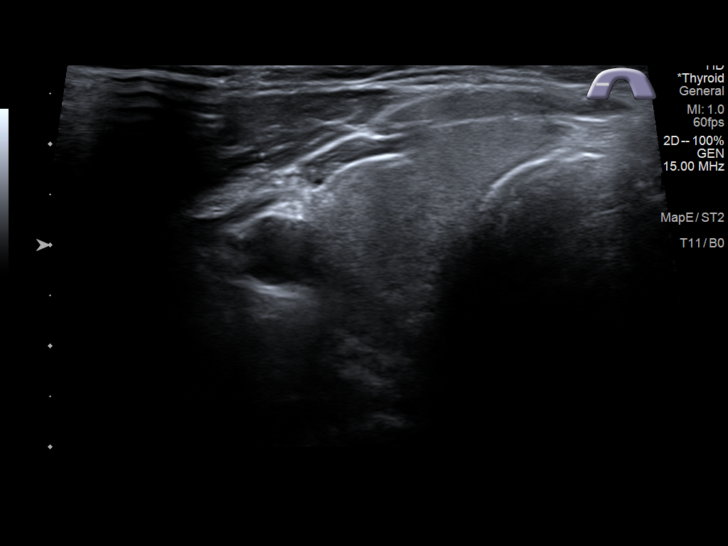
[im 17/37]
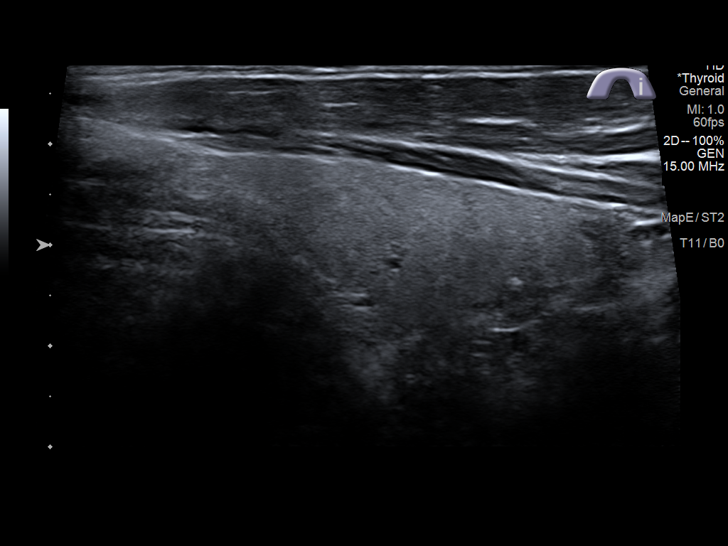
[im 20/37]
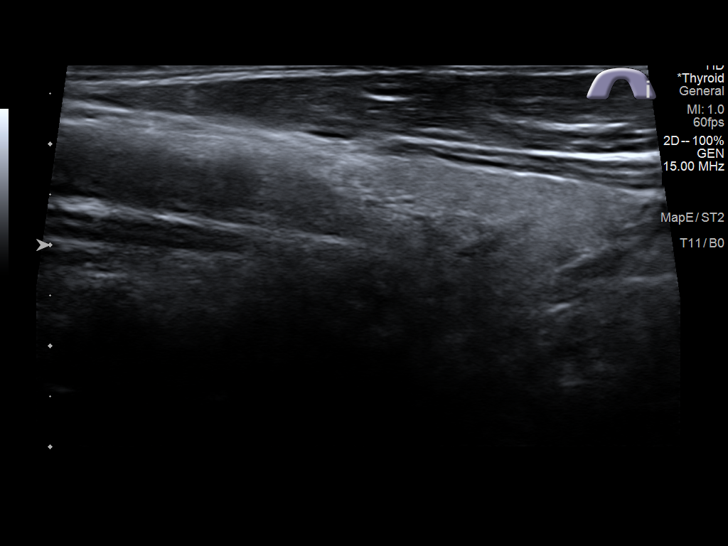
[im 23/37]
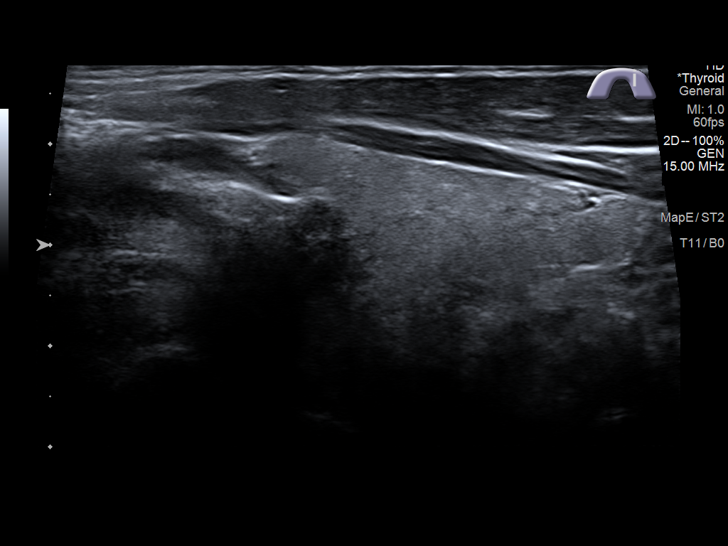
[im 25/37]
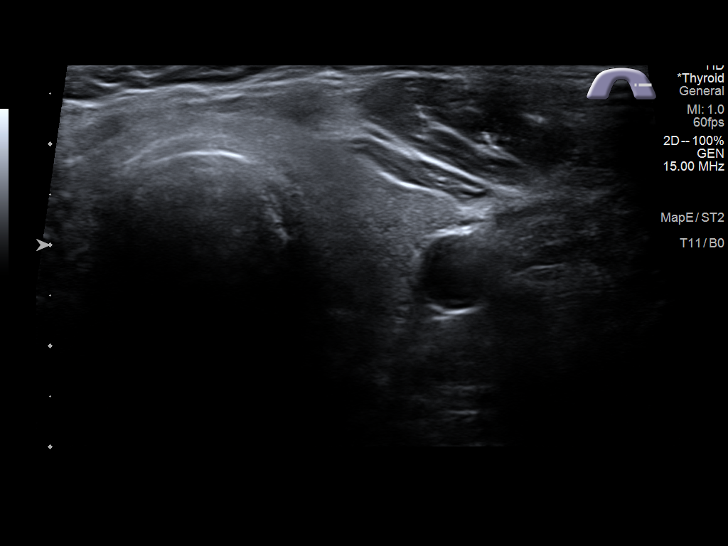
[im 28/37]
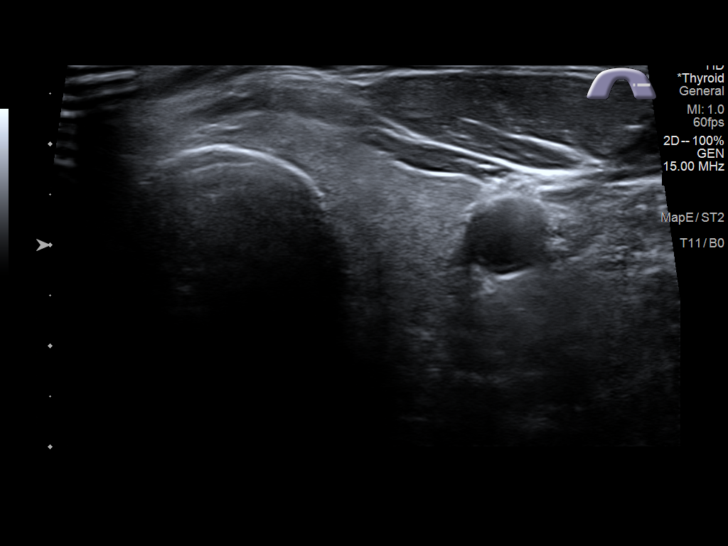
[im 31/37]
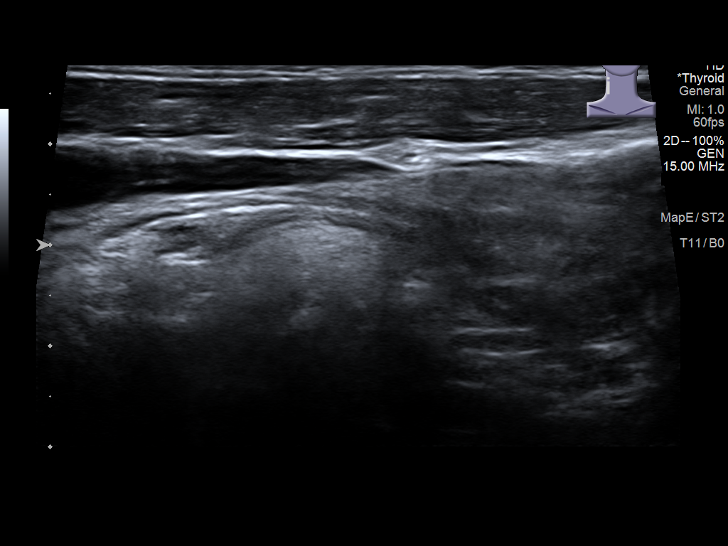
[im 34/37]
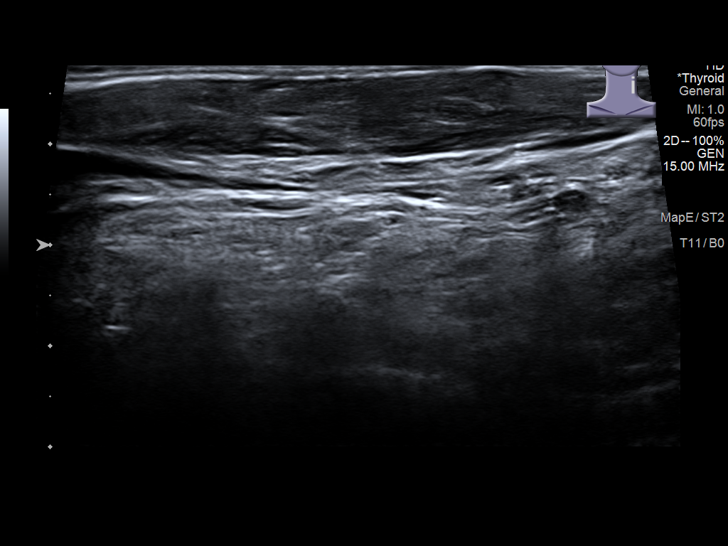
[im 37/37]
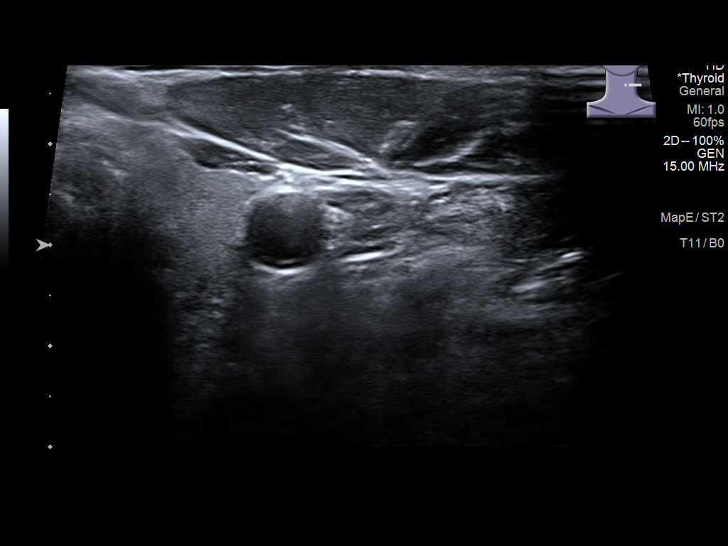

[14 of 25 positions shown; findings below may reference images not displayed]

FINDINGS: Parenchymal Echotexture: Normal

Isthmus: 0.4 cm

Right lobe: 4.8 cm x 1.8 cm x 1.6 cm

Left lobe: 4.0 cm x 1.7 cm x 1.6 cm

_________________________________________________________

Estimated total number of nodules >/= 1 cm: 0

Number of spongiform nodules >/=  2 cm not described below (TR1): 0

Number of mixed cystic and solid nodules >/= 1.5 cm not described
below (TR2): 0

_________________________________________________________

No discrete nodules are seen within the thyroid gland.

No adenopathy
IMPRESSION: Unremarkable sonographic survey of the thyroid
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
## 2022-02-11 ENCOUNTER — Encounter: Payer: Self-pay | Admitting: Cardiovascular Disease

## 2022-02-11 ENCOUNTER — Ambulatory Visit: Payer: Medicare HMO | Admitting: Cardiovascular Disease

## 2022-02-11 VITALS — BP 136/72 | HR 64 | Ht 69.0 in | Wt 175.6 lb

## 2022-02-11 DIAGNOSIS — E785 Hyperlipidemia, unspecified: Secondary | ICD-10-CM | POA: Diagnosis not present

## 2022-02-11 DIAGNOSIS — I25118 Atherosclerotic heart disease of native coronary artery with other forms of angina pectoris: Secondary | ICD-10-CM

## 2022-02-11 DIAGNOSIS — I10 Essential (primary) hypertension: Secondary | ICD-10-CM | POA: Diagnosis not present

## 2022-02-11 NOTE — Patient Instructions (Addendum)
Medication Instructions:  No changes  If you need a refill on your cardiac medications before your next appointment, please call your pharmacy.   Lab work: No new labs needed  Testing/Procedures: No new testing needed  Follow-Up: At [facility], you and your health needs are our priority.  As part of our continuing mission to provide you with exceptional heart care, we have created designated Provider Care Teams.  These Care Teams include your primary Cardiologist (physician) and Advanced Practice Providers (APPs -  Physician Assistants and Nurse Practitioners) who all work together to provide you with the care you need, when you need it.  You will need a follow up appointment in 12 months  Providers on your designated Care Team:   Christopher Berge, NP Ryan Dunn, PA-C Cadence Furth, PA-C  COVID-19 Vaccine Information can be found at: [email or web address] For questions related to vaccine distribution or appointments, please email [email or web address] or call [phone].   

## 2022-04-22 LAB — FECAL OCCULT BLOOD, IMMUNOCHEMICAL: IFOBT: NEGATIVE

## 2022-04-28 ENCOUNTER — Other Ambulatory Visit: Payer: Medicare HMO

## 2022-05-02 ENCOUNTER — Ambulatory Visit (INDEPENDENT_AMBULATORY_CARE_PROVIDER_SITE_OTHER): Payer: Medicare HMO

## 2022-05-02 ENCOUNTER — Encounter: Payer: Medicare HMO | Admitting: Family Medicine

## 2022-05-02 VITALS — Ht 68.0 in | Wt 174.0 lb

## 2022-05-02 DIAGNOSIS — Z0000 Encounter for general adult medical examination without abnormal findings: Secondary | ICD-10-CM
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
## 2022-05-02 NOTE — Patient Instructions (Signed)
Joseph Shea , Thank you for taking time to come for your Medicare Wellness Visit. I appreciate your ongoing commitment to your health goals. Please review the following plan we discussed and let me know if I can assist you in the future.   Screening recommendations/referrals: Colonoscopy: FOBT 04/20/2021, due Recommended yearly ophthalmology/optometry visit for glaucoma screening and checkup Recommended yearly dental visit for hygiene and checkup  Vaccinations: Influenza vaccine: due Pneumococcal vaccine: completed 05/01/2021 Tdap vaccine: completed 10/16/2015, due 10/15/2025 Shingles vaccine: discussed   Covid-19:  11/18/2019, 10/28/2019  Advanced directives: Advance directive discussed with you today. .  Conditions/risks identified: none  Next appointment: Follow up in one year for your annual wellness visit.   Preventive Care 66 Years and Older, Male Preventive care refers to lifestyle choices and visits with your health care provider that can promote health and wellness. What does preventive care include? A yearly physical exam. This is also called an annual well check. Dental exams once or twice a year. Routine eye exams. Ask your health care provider how often you should have your eyes checked. Personal lifestyle choices, including: Daily care of your teeth and gums. Regular physical activity. Eating a healthy diet. Avoiding tobacco and drug use. Limiting alcohol use. Practicing safe sex. Taking low doses of aspirin every day. Taking vitamin and mineral supplements as recommended by your health care provider. What happens during an annual well check? The services and screenings done by your health care provider during your annual well check will depend on your age, overall health, lifestyle risk factors, and family history of disease. Counseling  Your health care provider may ask you questions about your: Alcohol use. Tobacco use. Drug use. Emotional well-being. Home and  relationship well-being. Sexual activity. Eating habits. History of falls. Memory and ability to understand (cognition). Work and work Astronomer. Screening  You may have the following tests or measurements: Height, weight, and BMI. Blood pressure. Lipid and cholesterol levels. These may be checked every 5 years, or more frequently if you are over 60 years old. Skin check. Lung cancer screening. You may have this screening every year starting at age 49 if you have a 30-pack-year history of smoking and currently smoke or have quit within the past 15 years. Fecal occult blood test (FOBT) of the stool. You may have this test every year starting at age 69. Flexible sigmoidoscopy or colonoscopy. You may have a sigmoidoscopy every 5 years or a colonoscopy every 10 years starting at age 51. Prostate cancer screening. Recommendations will vary depending on your family history and other risks. Hepatitis C blood test. Hepatitis B blood test. Sexually transmitted disease (STD) testing. Diabetes screening. This is done by checking your blood sugar (glucose) after you have not eaten for a while (fasting). You may have this done every 1-3 years. Abdominal aortic aneurysm (AAA) screening. You may need this if you are a current or former smoker. Osteoporosis. You may be screened starting at age 42 if you are at high risk. Talk with your health care provider about your test results, treatment options, and if necessary, the need for more tests. Vaccines  Your health care provider may recommend certain vaccines, such as: Influenza vaccine. This is recommended every year. Tetanus, diphtheria, and acellular pertussis (Tdap, Td) vaccine. You may need a Td booster every 10 years. Zoster vaccine. You may need this after age 55. Pneumococcal 13-valent conjugate (PCV13) vaccine. One dose is recommended after age 25. Pneumococcal polysaccharide (PPSV23) vaccine. One dose is recommended after  age 2. Talk to your  health care provider about which screenings and vaccines you need and how often you need them. This information is not intended to replace advice given to you by your health care provider. Make sure you discuss any questions you have with your health care provider. Document Released: 10/05/2015 Document Revised: 05/28/2016 Document Reviewed: 07/10/2015 Elsevier Interactive Patient Education  2017 [place] Prevention in the Home Falls can cause injuries. They can happen to people of all ages. There are many things you can do to make your home safe and to help prevent falls. What can I do on the outside of my home? Regularly fix the edges of walkways and driveways and fix any cracks. Remove anything that might make you trip as you walk through a door, such as a raised step or threshold. Trim any bushes or trees on the path to your home. Use bright outdoor lighting. Clear any walking paths of anything that might make someone trip, such as rocks or tools. Regularly check to see if handrails are loose or broken. Make sure that both sides of any steps have handrails. Any raised decks and porches should have guardrails on the edges. Have any leaves, snow, or ice cleared regularly. Use sand or salt on walking paths during winter. Clean up any spills in your garage right away. This includes oil or grease spills. What can I do in the bathroom? Use night lights. Install grab bars by the toilet and in the tub and shower. Do not use towel bars as grab bars. Use non-skid mats or decals in the tub or shower. If you need to sit down in the shower, use a plastic, non-slip stool. Keep the floor dry. Clean up any water that spills on the floor as soon as it happens. Remove soap buildup in the tub or shower regularly. Attach bath mats securely with double-sided non-slip rug tape. Do not have throw rugs and other things on the floor that can make you trip. What can I do in the bedroom? Use night  lights. Make sure that you have a light by your bed that is easy to reach. Do not use any sheets or blankets that are too big for your bed. They should not hang down onto the floor. Have a firm chair that has side arms. You can use this for support while you get dressed. Do not have throw rugs and other things on the floor that can make you trip. What can I do in the kitchen? Clean up any spills right away. Avoid walking on wet floors. Keep items that you use a lot in easy-to-reach places. If you need to reach something above you, use a strong step stool that has a grab bar. Keep electrical cords out of the way. Do not use floor polish or wax that makes floors slippery. If you must use wax, use non-skid floor wax. Do not have throw rugs and other things on the floor that can make you trip. What can I do with my stairs? Do not leave any items on the stairs. Make sure that there are handrails on both sides of the stairs and use them. Fix handrails that are broken or loose. Make sure that handrails are as long as the stairways. Check any carpeting to make sure that it is firmly attached to the stairs. Fix any carpet that is loose or worn. Avoid having throw rugs at the top or bottom of the stairs. If you do  have throw rugs, attach them to the floor with carpet tape. Make sure that you have a light switch at the top of the stairs and the bottom of the stairs. If you do not have them, ask someone to add them for you. What else can I do to help prevent falls? Wear shoes that: Do not have high heels. Have rubber bottoms. Are comfortable and fit you well. Are closed at the toe. Do not wear sandals. If you use a stepladder: Make sure that it is fully opened. Do not climb a closed stepladder. Make sure that both sides of the stepladder are locked into place. Ask someone to hold it for you, if possible. Clearly mark and make sure that you can see: Any grab bars or handrails. First and last  steps. Where the edge of each step is. Use tools that help you move around (mobility aids) if they are needed. These include: Canes. Walkers. Scooters. Crutches. Turn on the lights when you go into a dark area. Replace any light bulbs as soon as they burn out. Set up your furniture so you have a clear path. Avoid moving your furniture around. If any of your floors are uneven, fix them. If there are any pets around you, be aware of where they are. Review your medicines with your doctor. Some medicines can make you feel dizzy. This can increase your chance of falling. Ask your doctor what other things that you can do to help prevent falls. This information is not intended to replace advice given to you by your health care provider. Make sure you discuss any questions you have with your health care provider. Document Released: 07/05/2009 Document Revised: 02/14/2016 Document Reviewed: 10/13/2014 Elsevier Interactive Patient Education  2017 [employer].

## 2022-05-05 ENCOUNTER — Other Ambulatory Visit: Payer: Self-pay | Admitting: Family Medicine

## 2022-05-05 DIAGNOSIS — I10 Essential (primary) hypertension: Secondary | ICD-10-CM

## 2022-05-06 ENCOUNTER — Encounter: Payer: Self-pay | Admitting: Family Medicine

## 2022-05-29 NOTE — Progress Notes (Signed)
[facility] Quality Team Note  Name: Lawrence Mitch Date of Birth: [birth year] MRN: [id number] Date: 05/29/2022  [facility] Quality Team has reviewed this patient's chart, please see recommendations below:  [facility] Quality Other; Pt has open quality gap for HBD, needs A1C that is within range to close. Please address this at next visit.

## 2022-06-11 ENCOUNTER — Other Ambulatory Visit: Payer: Self-pay | Admitting: Family Medicine

## 2022-06-28 ENCOUNTER — Other Ambulatory Visit: Payer: Self-pay | Admitting: Family Medicine

## 2022-06-28 DIAGNOSIS — D696 Thrombocytopenia, unspecified: Secondary | ICD-10-CM

## 2022-06-28 DIAGNOSIS — E782 Mixed hyperlipidemia: Secondary | ICD-10-CM

## 2022-06-28 DIAGNOSIS — E1169 Type 2 diabetes mellitus with other specified complication: Secondary | ICD-10-CM

## 2022-06-28 DIAGNOSIS — Z125 Encounter for screening for malignant neoplasm of prostate: Secondary | ICD-10-CM

## 2022-07-02 ENCOUNTER — Other Ambulatory Visit (INDEPENDENT_AMBULATORY_CARE_PROVIDER_SITE_OTHER): Payer: Medicare HMO

## 2022-07-02 DIAGNOSIS — Z125 Encounter for screening for malignant neoplasm of prostate: Secondary | ICD-10-CM | POA: Diagnosis not present

## 2022-07-02 DIAGNOSIS — E782 Mixed hyperlipidemia: Secondary | ICD-10-CM

## 2022-07-02 DIAGNOSIS — E1169 Type 2 diabetes mellitus with other specified complication: Secondary | ICD-10-CM

## 2022-07-02 DIAGNOSIS — D696 Thrombocytopenia, unspecified: Secondary | ICD-10-CM | POA: Diagnosis not present

## 2022-07-02 LAB — COMPREHENSIVE METABOLIC PANEL
ALT: 29 U/L (ref 0–53)
AST: 18 U/L (ref 0–37)
Albumin: 4.7 g/dL (ref 3.5–5.2)
Alkaline Phosphatase: 63 U/L (ref 39–117)
BUN: 23 mg/dL (ref 6–23)
CO2: 28 mEq/L (ref 19–32)
Calcium: 9.6 mg/dL (ref 8.4–10.5)
Chloride: 101 mEq/L (ref 96–112)
Creatinine, Ser: 0.97 mg/dL (ref 0.40–1.50)
GFR: 79.72 mL/min (ref >60.00)
Glucose, Bld: 225 mg/dL — ABNORMAL HIGH (ref 70–99)
Potassium: 4.6 mEq/L (ref 3.5–5.1)
Sodium: 136 mEq/L (ref 135–145)
Total Bilirubin: 1.3 mg/dL — ABNORMAL HIGH (ref 0.2–1.2)
Total Protein: 6.8 g/dL (ref 6.0–8.3)

## 2022-07-02 LAB — CBC WITH DIFFERENTIAL/PLATELET
Basophils Absolute: 0 10*3/uL (ref 0.0–0.1)
Basophils Relative: 0.4 % (ref 0.0–3.0)
Eosinophils Absolute: 0.1 10*3/uL (ref 0.0–0.7)
Eosinophils Relative: 1.1 % (ref 0.0–5.0)
HCT: 45.9 % (ref 39.0–52.0)
Hemoglobin: 16 g/dL (ref 13.0–17.0)
Lymphocytes Relative: 22.4 % (ref 12.0–46.0)
Lymphs Abs: 1.1 10*3/uL (ref 0.7–4.0)
MCHC: 34.9 g/dL (ref 30.0–36.0)
MCV: 93.3 fl (ref 78.0–100.0)
Monocytes Absolute: 0.3 10*3/uL (ref 0.1–1.0)
Monocytes Relative: 7.1 % (ref 3.0–12.0)
Neutro Abs: 3.4 10*3/uL (ref 1.4–7.7)
Neutrophils Relative %: 69 % (ref 43.0–77.0)
Platelets: 117 10*3/uL — ABNORMAL LOW (ref 150.0–400.0)
RBC: 4.92 Mil/uL (ref 4.22–5.81)
RDW: 13.8 % (ref 11.5–15.5)
WBC: 4.9 10*3/uL (ref 4.0–10.5)

## 2022-07-02 LAB — LIPID PANEL
Cholesterol: 145 mg/dL (ref 0–200)
HDL: 57.3 mg/dL (ref >39.00)
LDL Cholesterol: 67 mg/dL (ref 0–99)
NonHDL: 87.65
Total CHOL/HDL Ratio: 3
Triglycerides: 105 mg/dL (ref 0.0–149.0)
VLDL: 21 mg/dL (ref 0.0–40.0)

## 2022-07-02 LAB — PSA: PSA: 0.45 ng/mL (ref 0.10–4.00)

## 2022-07-02 LAB — HEMOGLOBIN A1C: Hgb A1c MFr Bld: 6.9 % — ABNORMAL HIGH (ref 4.6–6.5)

## 2022-07-02 NOTE — Addendum Note (Signed)
Addended by: Ellamae Sia on: 07/02/2022 09:03 AM   Modules accepted: Orders

## 2022-07-04 ENCOUNTER — Other Ambulatory Visit: Payer: Self-pay | Admitting: Radiology

## 2022-07-04 DIAGNOSIS — E1169 Type 2 diabetes mellitus with other specified complication: Secondary | ICD-10-CM

## 2022-07-04 LAB — MICROALBUMIN / CREATININE URINE RATIO
Creatinine,U: 101.9 mg/dL
Microalb Creat Ratio: 1.1 mg/g (ref 0.0–30.0)
Microalb, Ur: 1.2 mg/dL (ref 0.0–1.9)

## 2022-07-09 ENCOUNTER — Encounter: Payer: Self-pay | Admitting: Family Medicine

## 2022-07-09 ENCOUNTER — Ambulatory Visit (INDEPENDENT_AMBULATORY_CARE_PROVIDER_SITE_OTHER): Payer: Medicare HMO | Admitting: Family Medicine

## 2022-07-09 VITALS — BP 118/74 | HR 66 | Temp 97.6°F | Ht 67.2 in | Wt 172.1 lb

## 2022-07-09 DIAGNOSIS — I10 Essential (primary) hypertension: Secondary | ICD-10-CM | POA: Diagnosis not present

## 2022-07-09 DIAGNOSIS — D696 Thrombocytopenia, unspecified: Secondary | ICD-10-CM

## 2022-07-09 DIAGNOSIS — Z7189 Other specified counseling: Secondary | ICD-10-CM

## 2022-07-09 DIAGNOSIS — Z0000 Encounter for general adult medical examination without abnormal findings: Secondary | ICD-10-CM | POA: Diagnosis not present

## 2022-07-09 DIAGNOSIS — E782 Mixed hyperlipidemia: Secondary | ICD-10-CM

## 2022-07-09 DIAGNOSIS — E1169 Type 2 diabetes mellitus with other specified complication: Secondary | ICD-10-CM

## 2022-07-09 DIAGNOSIS — I25118 Atherosclerotic heart disease of native coronary artery with other forms of angina pectoris: Secondary | ICD-10-CM | POA: Diagnosis not present

## 2022-07-09 NOTE — Patient Instructions (Addendum)
We will request latest diabetic eye exam from [facility] at [facility].  Work on low sugar low carb diet.  Return in 3 months for lab visit only to recheck sugar levels. Good to see you today Return in 6 months for diabetes follow up visit  Return in 1 year for next physical /wellness visit   Health Maintenance After Age 69 After age 60, you are at a higher risk for certain long-term diseases and infections as well as injuries from falls. Falls are a major cause of broken bones and head injuries in people who are older than age 11. Getting regular preventive care can help to keep you healthy and well. Preventive care includes getting regular testing and making lifestyle changes as recommended by your health care provider. Talk with your health care provider about: Which screenings and tests you should have. A screening is a test that checks for a disease when you have no symptoms. A diet and exercise plan that is right for you. What should I know about screenings and tests to prevent falls? Screening and testing are the best ways to find a health problem early. Early diagnosis and treatment give you the best chance of managing medical conditions that are common after age 12. Certain conditions and lifestyle choices may make you more likely to have a fall. Your health care provider may recommend: Regular vision checks. Poor vision and conditions such as cataracts can make you more likely to have a fall. If you wear glasses, make sure to get your prescription updated if your vision changes. Medicine review. Work with your health care provider to regularly review all of the medicines you are taking, including over-the-counter medicines. Ask your health care provider about any side effects that may make you more likely to have a fall. Tell your health care provider if any medicines that you take make you feel dizzy or sleepy. Strength and balance checks. Your health care provider may recommend  certain tests to check your strength and balance while standing, walking, or changing positions. Foot health exam. Foot pain and numbness, as well as not wearing proper footwear, can make you more likely to have a fall. Screenings, including: Osteoporosis screening. Osteoporosis is a condition that causes the bones to get weaker and break more easily. Blood pressure screening. Blood pressure changes and medicines to control blood pressure can make you feel dizzy. Depression screening. You may be more likely to have a fall if you have a fear of falling, feel depressed, or feel unable to do activities that you used to do. Alcohol use screening. Using too much alcohol can affect your balance and may make you more likely to have a fall. Follow these instructions at home: Lifestyle Do not drink alcohol if: Your health care provider tells you not to drink. If you drink alcohol: Limit how much you have to: 0-1 drink a day for women. 0-2 drinks a day for men. Know how much alcohol is in your drink. In the [place] one drink equals one 12 oz bottle of beer (355 mL), one 5 oz glass of wine (148 mL), or one 1 oz glass of hard liquor (44 mL). Do not use any products that contain nicotine or tobacco. These products include cigarettes, chewing tobacco, and vaping devices, such as e-cigarettes. If you need help quitting, ask your health care provider. Activity  Follow a regular exercise program to stay fit. This will help you maintain your balance. Ask your health care provider what types  of exercise are appropriate for you. If you need a cane or walker, use it as recommended by your health care provider. Wear supportive shoes that have nonskid soles. Safety  Remove any tripping hazards, such as rugs, cords, and clutter. Install safety equipment such as grab bars in bathrooms and safety rails on stairs. Keep rooms and walkways well-lit. General instructions Talk with your health care provider about your  risks for falling. Tell your health care provider if: You fall. Be sure to tell your health care provider about all falls, even ones that seem minor. You feel dizzy, tiredness (fatigue), or off-balance. Take over-the-counter and prescription medicines only as told by your health care provider. These include supplements. Eat a healthy diet and maintain a healthy weight. A healthy diet includes low-fat dairy products, low-fat (lean) meats, and fiber from whole grains, beans, and lots of fruits and vegetables. Stay current with your vaccines. Schedule regular health, dental, and eye exams. Summary Having a healthy lifestyle and getting preventive care can help to protect your health and wellness after age 89. Screening and testing are the best way to find a health problem early and help you avoid having a fall. Early diagnosis and treatment give you the best chance for managing medical conditions that are more common for people who are older than age 74. Falls are a major cause of broken bones and head injuries in people who are older than age 21. Take precautions to prevent a fall at home. Work with your health care provider to learn what changes you can make to improve your health and wellness and to prevent falls. This information is not intended to replace advice given to you by your health care provider. Make sure you discuss any questions you have with your health care provider. Document Revised: 01/28/2021 Document Reviewed: 01/28/2021 Elsevier Patient Education  Joseph Shea.

## 2022-07-09 NOTE — Assessment & Plan Note (Signed)
Advanced directives - working on this. Wife is HCPOA. asked to bring [place] a copy when complete

## 2022-07-09 NOTE — Assessment & Plan Note (Signed)
Appreciate cardiology care. Sees Dr Rockey Situ yearly

## 2022-07-09 NOTE — Assessment & Plan Note (Signed)
Chronic, stable. Continue current regimen. 

## 2022-07-09 NOTE — Assessment & Plan Note (Signed)
Preventative protocols reviewed and updated unless pt declined. Discussed healthy diet and lifestyle.  

## 2022-07-09 NOTE — Progress Notes (Signed)
  Patient ID: Joseph Shea, male    DOB: 12/01/1950, 71 y.o.   MRN: [id number]  This visit was conducted in person.  BP 118/74   Pulse 66   Temp 97.6 F (36.4 C) (Temporal)   Ht 5' 7.2" (1.707 m)   Wt 172 lb 2 oz (78.1 kg)   SpO2 97%   BMI 26.80 kg/m    CC: CPE Subjective:   HPI: Joseph Shea is a 71 y.o. male presenting on 07/09/2022 for Annual Exam (MCR prt 2. )   Saw health advisor 04/2022 for medicare wellness visit. Note reviewed.    No results found.  Flowsheet Row Clinical Support from 05/02/2022 in [facility] [place]  PHQ-2 Total Score 0          05/02/2022    9:19 AM 04/26/2021   10:34 AM 04/25/2020    9:05 AM 04/25/2019    9:39 AM  Fall Risk   Falls in the past year? 0 0 0 0  Number falls in past yr: 0 0 0   Injury with Fall? 0 0 0   Risk for fall due to : Medication side effect Medication side effect Medication side effect   Follow up Falls evaluation completed;Education provided;Falls prevention discussed Falls evaluation completed;Falls prevention discussed Falls evaluation completed;Falls prevention discussed   Has been feeling well. Considering restarting part time job.   Preventative: Colon cancer screening - yearly stool kit, latest 04/2022 Prostate cancer screening - nocturia 2x. Continue yearly screen.  Lung cancer screening - not eligible  Flu shot yearly  Tdap 2017  COVID vaccine - Pfizer x2 10/2019  Pneumovax-23 - 04/2019, prevnar-20 04/2021 Shingrix - to consider  Advanced directives - working on this. Wife is HCPOA. asked to bring us a copy when complete.  Seat belt use discussed Sunscreen use discussed. No changing moles on skin.  Non smoker - wife smokes outside  Alcohol - none  Dentist - yearly  Eye exam - yearly ([facility] at [facility]) Bowel - no constipation  Bladder - no incontinence    Lives with wife Occupation: Truck driver, haz mat hauling  Activity: rides bike 2-3 x/wk a few miles, tries to  walk Diet: fruits/vegetables daily, small amt water     Relevant past medical, surgical, family and social history reviewed and updated as indicated. Interim medical history since our last visit reviewed. Allergies and medications reviewed and updated. Outpatient Medications Prior to Visit  Medication Sig Dispense Refill   aspirin 81 MG tablet Take 1 tablet (81 mg total) by mouth daily. 30 tablet    lisinopril (ZESTRIL) 40 MG tablet TAKE 1 TABLET BY MOUTH EVERY DAY 90 tablet 0   metoprolol tartrate (LOPRESSOR) 25 MG tablet TAKE 1 TABLET BY MOUTH TWICE A DAY 180 tablet 0   nitroGLYCERIN (NITROSTAT) 0.4 MG SL tablet Place 1 tablet (0.4 mg total) under the tongue every 5 (five) minutes as needed. Reported on 10/16/2015 30 tablet 1   rosuvastatin (CRESTOR) 20 MG tablet Take 1 tablet (20 mg total) by mouth daily. 90 tablet 3   Multiple Vitamin (MULTIVITAMIN PO) Take 1 tablet by mouth daily.       No facility-administered medications prior to visit.     Per HPI unless specifically indicated in ROS section below Review of Systems  Constitutional:  Negative for activity change, appetite change, chills, fatigue, fever and unexpected weight change.  HENT:  Negative for hearing loss.   Eyes:  Negative for visual disturbance.    Respiratory:  Negative for cough, chest tightness, shortness of breath and wheezing.   Cardiovascular:  Negative for chest pain, palpitations and leg swelling.  Gastrointestinal:  Negative for abdominal distention, abdominal pain, blood in stool, constipation, diarrhea, nausea and vomiting.  Genitourinary:  Negative for difficulty urinating and hematuria.  Musculoskeletal:  Negative for arthralgias, myalgias and neck pain.  Skin:  Negative for rash.  Neurological:  Negative for dizziness, seizures, syncope and headaches.  Hematological:  Negative for adenopathy. Does not bruise/bleed easily.  Psychiatric/Behavioral:  Negative for dysphoric mood. The patient is not  nervous/anxious.     Objective:  BP 118/74   Pulse 66   Temp 97.6 F (36.4 C) (Temporal)   Ht 5' 7.2" (1.707 m)   Wt 172 lb 2 oz (78.1 kg)   SpO2 97%   BMI 26.80 kg/m   Wt Readings from Last 3 Encounters:  07/09/22 172 lb 2 oz (78.1 kg)  05/02/22 174 lb (78.9 kg)  02/11/22 175 lb 9.6 oz (79.7 kg)      Physical Exam Vitals and nursing note reviewed.  Constitutional:      General: He is not in acute distress.    Appearance: Normal appearance. He is well-developed. He is not ill-appearing.  HENT:     Head: Normocephalic and atraumatic.     Right Ear: Hearing, tympanic membrane, ear canal and external ear normal.     Left Ear: Hearing, tympanic membrane, ear canal and external ear normal.     Mouth/Throat:     Mouth: Mucous membranes are moist.     Pharynx: Oropharynx is clear. No oropharyngeal exudate or posterior oropharyngeal erythema.  Eyes:     General: No scleral icterus.    Extraocular Movements: Extraocular movements intact.     Conjunctiva/sclera: Conjunctivae normal.     Pupils: Pupils are equal, round, and reactive to light.  Neck:     Thyroid: No thyroid mass or thyromegaly.     Vascular: No carotid bruit.  Cardiovascular:     Rate and Rhythm: Normal rate and regular rhythm.     Pulses: Normal pulses.          Radial pulses are 2+ on the right side and 2+ on the left side.     Heart sounds: Normal heart sounds. No murmur heard. Pulmonary:     Effort: Pulmonary effort is normal. No respiratory distress.     Breath sounds: Normal breath sounds. No wheezing, rhonchi or rales.  Abdominal:     General: Bowel sounds are normal. There is no distension.     Palpations: Abdomen is soft. There is no mass.     Tenderness: There is no abdominal tenderness. There is no guarding or rebound.     Hernia: No hernia is present.  Musculoskeletal:        General: Normal range of motion.     Cervical back: Normal range of motion and neck supple.     Right lower leg: No  edema.     Left lower leg: No edema.  Lymphadenopathy:     Cervical: No cervical adenopathy.  Skin:    General: Skin is warm and dry.     Findings: No rash.  Neurological:     General: No focal deficit present.     Mental Status: He is alert and oriented to person, place, and time.  Psychiatric:        Mood and Affect: Mood normal.        Behavior: Behavior normal.          Thought Content: Thought content normal.        Judgment: Judgment normal.       Results for orders placed or performed in visit on 07/04/22  Microalbumin / creatinine urine ratio  Result Value Ref Range   Microalb, Ur 1.2 0.0 - 1.9 mg/dL   Creatinine,U 101.9 mg/dL   Microalb Creat Ratio 1.1 0.0 - 30.0 mg/g    Assessment & Plan:   Problem List Items Addressed This Visit     Healthcare maintenance - Primary (Chronic)    Preventative protocols reviewed and updated unless pt declined. Discussed healthy diet and lifestyle.       Advanced care planning/counseling discussion (Chronic)    Advanced directives - working on this. Wife is HCPOA. asked to bring [place] a copy when complete      Mixed hyperlipidemia    Chronic, stable on crestor 29m daily. The 10-year ASCVD risk score (Arnett DK, et al., 2019) is: 24.5%   Values used to calculate the score:     Age: [age]years     Sex: Male     Is Non-Hispanic African American: No     Diabetic: Yes     Tobacco smoker: No     Systolic Blood Pressure: 1366mmHg     Is BP treated: Yes     HDL Cholesterol: 57.3 mg/dL     Total Cholesterol: 145 mg/dL       Thrombocytopenia (HCC)    Chronic, stable. ?ITP.       HYPERTENSION, BENIGN ESSENTIAL    Chronic, stable. Continue current regimen.       Type 2 diabetes mellitus with other specified complication (HCC)    Chronic, stable, diet controlled. CBG elevated today however A1c stable. rec low sugar low carb diet, will reassess control in 3 months       Relevant Orders   Hemoglobin A1c   Fructosamine    Atherosclerosis of native coronary artery of native heart with stable angina pectoris ([facility]    Appreciate cardiology care. Sees Dr GRockey Situyearly         No orders of the defined types were placed in this encounter.  Orders Placed This Encounter  Procedures   Hemoglobin A1c    Standing Status:   Future    Standing Expiration Date:   07/10/2023   Fructosamine    Standing Status:   Future    Standing Expiration Date:   07/10/2023    Patient instructions: [facility]  Work on low sugar low carb diet.  Return in 3 months for lab visit only to recheck sugar levels. Good to see you today Return in 1 year for next physical /wellness visit   Follow up plan: Return in about 6 months (around 01/08/2023) for follow up visit.  JRia Bush MD

## 2022-07-09 NOTE — Assessment & Plan Note (Addendum)
Chronic, stable, diet controlled. CBG elevated today however A1c stable. rec low sugar low carb diet, will reassess control in 3 months

## 2022-07-09 NOTE — Assessment & Plan Note (Signed)
Chronic, stable. ?ITP 

## 2022-07-09 NOTE — Assessment & Plan Note (Signed)
Chronic, stable on crestor 20mg  daily. The 10-year ASCVD risk score (Arnett DK, et al., 2019) is: 24.5%   Values used to calculate the score:     Age: 71 years     Sex: Male     Is Non-Hispanic African American: No     Diabetic: Yes     Tobacco smoker: No     Systolic Blood Pressure: 973 mmHg     Is BP treated: Yes     HDL Cholesterol: 57.3 mg/dL     Total Cholesterol: 145 mg/dL

## 2022-07-11 ENCOUNTER — Encounter: Payer: Self-pay | Admitting: Family Medicine

## 2022-07-25 ENCOUNTER — Other Ambulatory Visit: Payer: Self-pay | Admitting: Family Medicine

## 2022-07-25 DIAGNOSIS — E782 Mixed hyperlipidemia: Secondary | ICD-10-CM

## 2022-08-10 ENCOUNTER — Other Ambulatory Visit: Payer: Self-pay | Admitting: Family Medicine

## 2022-08-10 DIAGNOSIS — I10 Essential (primary) hypertension: Secondary | ICD-10-CM

## 2022-09-11 ENCOUNTER — Other Ambulatory Visit: Payer: Self-pay | Admitting: Family Medicine

## 2022-09-11 DIAGNOSIS — I10 Essential (primary) hypertension: Secondary | ICD-10-CM

## 2022-10-09 ENCOUNTER — Other Ambulatory Visit (INDEPENDENT_AMBULATORY_CARE_PROVIDER_SITE_OTHER): Payer: Medicare HMO

## 2022-10-09 DIAGNOSIS — E1169 Type 2 diabetes mellitus with other specified complication: Secondary | ICD-10-CM | POA: Diagnosis not present

## 2022-10-09 LAB — HEMOGLOBIN A1C: Hgb A1c MFr Bld: 7.3 % — ABNORMAL HIGH (ref 4.6–6.5)

## 2022-10-14 LAB — FRUCTOSAMINE: Fructosamine: 413 umol/L — ABNORMAL HIGH (ref 205–285)

## 2022-11-07 ENCOUNTER — Telehealth: Payer: Self-pay | Admitting: Family Medicine

## 2022-11-07 DIAGNOSIS — E1169 Type 2 diabetes mellitus with other specified complication: Secondary | ICD-10-CM

## 2022-11-07 MED ORDER — ONETOUCH DELICA PLUS LANCET33G MISC: 3 refills | Status: DC

## 2022-11-07 MED ORDER — ONETOUCH ULTRA 2 W/DEVICE KIT: PACK | 0 refills | Status: AC

## 2022-11-07 MED ORDER — ONETOUCH ULTRA VI STRP: ORAL_STRIP | 3 refills | Status: DC

## 2022-11-07 NOTE — Telephone Encounter (Signed)
Noted. Sent rxs for OneTouch Ultra 2 meter, OneTouch Ultra test strips and OneTouch Delica Plus lancets to [facility].

## 2022-11-07 NOTE — Telephone Encounter (Signed)
Patient's wife called insurance company.  One touch One touch ultra Strips and lancets for either monitor are covered as well

## 2022-11-07 NOTE — Telephone Encounter (Signed)
Patient wife called in and wanted to know if a blood glucose monitor and test strips could be called in for the patient. She stated that his level has been 280, 214, 226, 185, and 186 with fasting. She stated that they have been using an old monitor and not sure if it is accurate. She stated that he hasn't had any other symptoms. Please advise. Thank you!

## 2022-11-12 ENCOUNTER — Other Ambulatory Visit: Payer: Self-pay | Admitting: Family Medicine

## 2022-11-12 DIAGNOSIS — I10 Essential (primary) hypertension: Secondary | ICD-10-CM

## 2023-01-09 ENCOUNTER — Encounter: Payer: Self-pay | Admitting: Family Medicine

## 2023-01-09 ENCOUNTER — Ambulatory Visit (INDEPENDENT_AMBULATORY_CARE_PROVIDER_SITE_OTHER): Payer: Medicare HMO | Admitting: Family Medicine

## 2023-01-09 VITALS — BP 130/70 | HR 57 | Temp 97.2°F | Ht 67.2 in | Wt 169.2 lb

## 2023-01-09 DIAGNOSIS — Z8349 Family history of other endocrine, nutritional and metabolic diseases: Secondary | ICD-10-CM

## 2023-01-09 DIAGNOSIS — E1169 Type 2 diabetes mellitus with other specified complication: Secondary | ICD-10-CM | POA: Diagnosis not present

## 2023-01-09 LAB — IBC PANEL
Iron: 180 ug/dL — ABNORMAL HIGH (ref 42–165)
Saturation Ratios: 52.5 % — ABNORMAL HIGH (ref 20.0–50.0)
TIBC: 343 ug/dL (ref 250.0–450.0)
Transferrin: 245 mg/dL (ref 212.0–360.0)

## 2023-01-09 LAB — POCT GLYCOSYLATED HEMOGLOBIN (HGB A1C): Hemoglobin A1C: 6.2 % — AB (ref 4.0–5.6)

## 2023-01-09 LAB — FERRITIN: Ferritin: 218.5 ng/mL (ref 22.0–322.0)

## 2023-01-09 NOTE — Patient Instructions (Addendum)
Call to schedule diabetic eye exam.  Labs today.  Good to see you today  Return in 6 months for physical

## 2023-01-09 NOTE — Progress Notes (Signed)
Ph: [phone]       Fax: [phone]   Patient ID: Joseph Shea, male    DOB: 11-27-1950, 72 y.o.   MRN: [id number]  This visit was conducted in person.  BP 130/70   Pulse (!) 57   Temp (!) 97.2 F (36.2 C) (Temporal)   Ht 5' 7.2" (1.707 m)   Wt 169 lb 4 oz (76.8 kg)   SpO2 99%   BMI 26.35 kg/m    CC: 6 mo DM f/u visit  Subjective:   HPI: Joseph Shea is a 72 y.o. male presenting on 01/09/2023 for Medical Management of Chronic Issues (Here for 6 mo DM f/u. Pt accompanied by wife, Darl Pikes. )   Son Thayer Ohm recently diagnosed with hemochromatosis - pt and wife ask about getting tested. No other fmhx hemochromatosis.   DM - does regularly check sugars fasting 150-180s. 7d average 163, 30d 157. Compliant with antihyperglycemic regimen which includes: diet controlled. Has significantly changed diet - limiting simple carbs. Avoiding sweets and sweetened beverages. Denies low sugars or hypoglycemic symptoms. Denies paresthesias, blurry vision. Last diabetic eye exam - 09/2021. Glucometer brand: one touch ultra. Last foot exam: 07/2021 - DUE. DSME: declined.  Lab Results  Component Value Date   HGBA1C 6.2 (A) 01/09/2023   Diabetic Foot Exam - Simple   Simple Foot Form Diabetic Foot exam was performed with the following findings: Yes 01/09/2023 10:23 AM  Visual Inspection No deformities, no ulcerations, no other skin breakdown bilaterally: Yes Sensation Testing Intact to touch and monofilament testing bilaterally: Yes Pulse Check Posterior Tibialis and Dorsalis pulse intact bilaterally: Yes Comments No claudication    Lab Results  Component Value Date   MICROALBUR 1.2 07/04/2022         Relevant past medical, surgical, family and social history reviewed and updated as indicated. Interim medical history since our last visit reviewed. Allergies and medications reviewed and updated. Outpatient Medications Prior to Visit  Medication Sig Dispense Refill   aspirin 81 MG  tablet Take 1 tablet (81 mg total) by mouth daily. 30 tablet    Blood Glucose Monitoring Suppl (ONE TOUCH ULTRA 2) w/Device KIT Use as instructed to check blood sugar once a day 1 kit 0   glucose blood (ONETOUCH ULTRA) test strip Use as instructed to check blood sugar once a day 100 each 3   Lancets (ONETOUCH DELICA PLUS LANCET33G) MISC Use as instructed to check blood sugar once a day 100 each 3   lisinopril (ZESTRIL) 40 MG tablet TAKE 1 TABLET BY MOUTH EVERY DAY 90 tablet 2   metoprolol tartrate (LOPRESSOR) 25 MG tablet TAKE 1 TABLET BY MOUTH TWICE A DAY 180 tablet 3   nitroGLYCERIN (NITROSTAT) 0.4 MG SL tablet Place 1 tablet (0.4 mg total) under the tongue every 5 (five) minutes as needed. Reported on 10/16/2015 30 tablet 1   rosuvastatin (CRESTOR) 20 MG tablet TAKE 1 TABLET BY MOUTH EVERY DAY 90 tablet 3   No facility-administered medications prior to visit.     Per HPI unless specifically indicated in ROS section below Review of Systems  Objective:  BP 130/70   Pulse (!) 57   Temp (!) 97.2 F (36.2 C) (Temporal)   Ht 5' 7.2" (1.707 m)   Wt 169 lb 4 oz (76.8 kg)   SpO2 99%   BMI 26.35 kg/m   Wt Readings from Last 3 Encounters:  01/09/23 169 lb 4 oz (76.8 kg)  07/09/22 172 lb 2 oz (78.1 kg)  05/02/22 174 lb (78.9 kg)      Physical Exam Vitals and nursing note reviewed.  Constitutional:      Appearance: Normal appearance. He is not ill-appearing.  Eyes:     Extraocular Movements: Extraocular movements intact.     Conjunctiva/sclera: Conjunctivae normal.     Pupils: Pupils are equal, round, and reactive to light.  Cardiovascular:     Rate and Rhythm: Normal rate and regular rhythm.     Pulses: Normal pulses.     Heart sounds: Normal heart sounds. No murmur heard. Pulmonary:     Effort: Pulmonary effort is normal. No respiratory distress.     Breath sounds: Normal breath sounds. No wheezing, rhonchi or rales.  Abdominal:     General: Bowel sounds are normal. There is no  distension.     Palpations: Abdomen is soft. There is no mass.     Tenderness: There is no abdominal tenderness. There is no guarding or rebound.     Hernia: No hernia is present.  Musculoskeletal:     Right lower leg: No edema.     Left lower leg: No edema.     Comments: See HPI for foot exam if done  Skin:    General: Skin is warm and dry.     Findings: No rash.     Comments: Bronzing of skin present - he does get a lot of sun  Neurological:     Mental Status: He is alert.  Psychiatric:        Mood and Affect: Mood normal.        Behavior: Behavior normal.       Results for orders placed or performed in visit on 01/09/23  POCT glycosylated hemoglobin (Hb A1C)  Result Value Ref Range   Hemoglobin A1C 6.2 (A) 4.0 - 5.6 %   HbA1c POC (<> result, manual entry)     HbA1c, POC (prediabetic range)     HbA1c, POC (controlled diabetic range)     Lab Results  Component Value Date   CREATININE 0.97 07/02/2022   BUN 23 07/02/2022   NA 136 07/02/2022   K 4.6 07/02/2022   CL 101 07/02/2022   CO2 28 07/02/2022    Lab Results  Component Value Date   ALT 29 07/02/2022   AST 18 07/02/2022   ALKPHOS 63 07/02/2022   BILITOT 1.3 (H) 07/02/2022    Lab Results  Component Value Date   WBC 4.9 07/02/2022   HGB 16.0 07/02/2022   HCT 45.9 07/02/2022   MCV 93.3 07/02/2022   PLT 117.0 (L) 07/02/2022    Assessment & Plan:   Problem List Items Addressed This Visit     Type 2 diabetes mellitus with other specified complication - Primary    Chronic, stable, diet controlled. Predominantly impaired fasting sugars noted. Congratulated on ongoing good control based on A1c 6.2%.  Declines DSME      Relevant Orders   POCT glycosylated hemoglobin (Hb A1C) (Completed)   Family history of hemochromatosis    New diagnosis in son - who is homozygous for this.  Check iron panel and hemochromatosis DNA PCR       Relevant Orders   Ferritin   IBC panel   Hemochromatosis DNA-PCR(c282y,h63d)      No orders of the defined types were placed in this encounter.   Orders Placed This Encounter  Procedures   Ferritin   IBC panel   Hemochromatosis DNA-PCR(c282y,h63d)   POCT glycosylated hemoglobin (Hb A1C)  Patient Instructions  Call to schedule diabetic eye exam.  Labs today.  Good to see you today  Return in 6 months for physical  Follow up plan: Return in about 6 months (around 07/11/2023) for annual exam, prior fasting for blood work, medicare wellness visit.  Eustaquio Boyden, MD

## 2023-01-09 NOTE — Assessment & Plan Note (Addendum)
New diagnosis in son - who is homozygous for this.  Check iron panel and hemochromatosis DNA PCR

## 2023-01-09 NOTE — Assessment & Plan Note (Addendum)
Chronic, stable, diet controlled. Predominantly impaired fasting sugars noted. Congratulated on ongoing good control based on A1c 6.2%.  Declines DSME

## 2023-01-10 ENCOUNTER — Encounter: Payer: Self-pay | Admitting: Family Medicine

## 2023-01-10 NOTE — Addendum Note (Signed)
Addended by: Eustaquio Boyden on: 01/10/2023 12:53 PM   Modules accepted: Orders, Level of Service

## 2023-01-13 ENCOUNTER — Encounter: Payer: Self-pay | Admitting: Family Medicine

## 2023-01-16 ENCOUNTER — Telehealth: Payer: Self-pay

## 2023-01-16 NOTE — Telephone Encounter (Signed)
Courtesy call placed to NP. No answer, VM left with appt details, clinic location, visitor/child policy. CB # left for any questions.

## 2023-01-19 ENCOUNTER — Encounter: Payer: Self-pay | Admitting: Oncology

## 2023-01-19 ENCOUNTER — Inpatient Hospital Stay: Payer: Medicare HMO

## 2023-01-19 ENCOUNTER — Inpatient Hospital Stay: Payer: Medicare HMO | Attending: Oncology | Admitting: Oncology

## 2023-01-19 ENCOUNTER — Telehealth: Payer: Self-pay | Admitting: Oncology

## 2023-01-19 VITALS — BP 122/83 | HR 57 | Temp 96.1°F | Resp 18 | Wt 170.1 lb

## 2023-01-19 DIAGNOSIS — Z823 Family history of stroke: Secondary | ICD-10-CM | POA: Diagnosis not present

## 2023-01-19 DIAGNOSIS — Z8349 Family history of other endocrine, nutritional and metabolic diseases: Secondary | ICD-10-CM

## 2023-01-19 DIAGNOSIS — I10 Essential (primary) hypertension: Secondary | ICD-10-CM | POA: Diagnosis not present

## 2023-01-19 DIAGNOSIS — Z832 Family history of diseases of the blood and blood-forming organs and certain disorders involving the immune mechanism: Secondary | ICD-10-CM | POA: Insufficient documentation

## 2023-01-19 DIAGNOSIS — Z8249 Family history of ischemic heart disease and other diseases of the circulatory system: Secondary | ICD-10-CM | POA: Insufficient documentation

## 2023-01-19 DIAGNOSIS — I2510 Atherosclerotic heart disease of native coronary artery without angina pectoris: Secondary | ICD-10-CM | POA: Diagnosis not present

## 2023-01-19 DIAGNOSIS — Z79899 Other long term (current) drug therapy: Secondary | ICD-10-CM | POA: Diagnosis not present

## 2023-01-19 DIAGNOSIS — R7989 Other specified abnormal findings of blood chemistry: Secondary | ICD-10-CM

## 2023-01-19 DIAGNOSIS — Z87891 Personal history of nicotine dependence: Secondary | ICD-10-CM | POA: Insufficient documentation

## 2023-01-19 DIAGNOSIS — Z5941 Food insecurity: Secondary | ICD-10-CM

## 2023-01-19 DIAGNOSIS — Z808 Family history of malignant neoplasm of other organs or systems: Secondary | ICD-10-CM | POA: Diagnosis not present

## 2023-01-19 DIAGNOSIS — R790 Abnormal level of blood mineral: Secondary | ICD-10-CM | POA: Insufficient documentation

## 2023-01-19 DIAGNOSIS — Z8041 Family history of malignant neoplasm of ovary: Secondary | ICD-10-CM

## 2023-01-19 DIAGNOSIS — E785 Hyperlipidemia, unspecified: Secondary | ICD-10-CM

## 2023-01-19 DIAGNOSIS — Z148 Genetic carrier of other disease: Secondary | ICD-10-CM | POA: Insufficient documentation

## 2023-01-19 LAB — CBC WITH DIFFERENTIAL (CANCER CENTER ONLY)
Abs Immature Granulocytes: 0.02 10*3/uL (ref 0.00–0.07)
Basophils Absolute: 0 10*3/uL (ref 0.0–0.1)
Basophils Relative: 0 %
Eosinophils Absolute: 0 10*3/uL (ref 0.0–0.5)
Eosinophils Relative: 1 %
HCT: 44 % (ref 39.0–52.0)
Hemoglobin: 15.9 g/dL (ref 13.0–17.0)
Immature Granulocytes: 0 %
Lymphocytes Relative: 17 %
Lymphs Abs: 1.1 10*3/uL (ref 0.7–4.0)
MCH: 32.1 pg (ref 26.0–34.0)
MCHC: 36.1 g/dL — ABNORMAL HIGH (ref 30.0–36.0)
MCV: 88.7 fL (ref 80.0–100.0)
Monocytes Absolute: 0.6 10*3/uL (ref 0.1–1.0)
Monocytes Relative: 9 %
Neutro Abs: 5 10*3/uL (ref 1.7–7.7)
Neutrophils Relative %: 73 %
Platelet Count: 121 10*3/uL — ABNORMAL LOW (ref 150–400)
RBC: 4.96 MIL/uL (ref 4.22–5.81)
RDW: 12.8 % (ref 11.5–15.5)
WBC Count: 6.9 10*3/uL (ref 4.0–10.5)
nRBC: 0 % (ref 0.0–0.2)

## 2023-01-19 LAB — CMP (CANCER CENTER ONLY)
ALT: 31 U/L (ref 0–44)
AST: 24 U/L (ref 15–41)
Albumin: 4.4 g/dL (ref 3.5–5.0)
Alkaline Phosphatase: 57 U/L (ref 38–126)
Anion gap: 6 (ref 5–15)
BUN: 22 mg/dL (ref 8–23)
CO2: 24 mmol/L (ref 22–32)
Calcium: 8.9 mg/dL (ref 8.9–10.3)
Chloride: 101 mmol/L (ref 98–111)
Creatinine: 0.95 mg/dL (ref 0.61–1.24)
GFR, Estimated: >60 mL/min (ref >60)
Glucose, Bld: 186 mg/dL — ABNORMAL HIGH (ref 70–99)
Potassium: 4.3 mmol/L (ref 3.5–5.1)
Sodium: 131 mmol/L — ABNORMAL LOW (ref 135–145)
Total Bilirubin: 1.7 mg/dL — ABNORMAL HIGH (ref 0.3–1.2)
Total Protein: 6.7 g/dL (ref 6.5–8.1)
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
## 2023-01-19 NOTE — Telephone Encounter (Signed)
Message sent to [facility] for schedule [place]- [facility]

## 2023-01-19 NOTE — Assessment & Plan Note (Signed)
Chronicly elevated total bilirubinemia, normal direct bili,likely Gilbert syndrome. No intervention.

## 2023-01-19 NOTE — Assessment & Plan Note (Signed)
Pending above work up 

## 2023-01-19 NOTE — Assessment & Plan Note (Signed)
Labs are reviewed and discussed with patient. I agrees with checking hemochomotosis mutation DNA, which has been in process, result is pending.  Recommend patient to avoid alcohol, iron supplementation, vitamin c supplementation.  Ferritin is <500, isolated iron saturation elevation, can continue observation unless there is signs of organ damage.  [place] RUQ for evaluation of liver.

## 2023-01-21 LAB — HEMOCHROMATOSIS DNA-PCR(C282Y,H63D)

## 2023-01-22 ENCOUNTER — Encounter: Payer: Self-pay | Admitting: Family Medicine

## 2023-01-22 ENCOUNTER — Telehealth: Payer: Self-pay | Admitting: Family Medicine

## 2023-01-22 DIAGNOSIS — Z148 Genetic carrier of other disease: Secondary | ICD-10-CM | POA: Insufficient documentation

## 2023-01-22 NOTE — Telephone Encounter (Signed)
Contacted Bradin Mcadory to schedule their annual wellness visit. Appointment made for 03/05/2023.  [facility] Care Guide [facility] AWV TEAM Direct Dial: [phone]

## 2023-03-05 ENCOUNTER — Ambulatory Visit (INDEPENDENT_AMBULATORY_CARE_PROVIDER_SITE_OTHER): Payer: Medicare HMO

## 2023-03-05 VITALS — Ht 67.0 in | Wt 166.0 lb

## 2023-03-05 DIAGNOSIS — Z0000 Encounter for general adult medical examination without abnormal findings: Secondary | ICD-10-CM
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
## 2023-03-05 NOTE — Patient Instructions (Signed)
[name] , Thank you for taking time to come for your Medicare Wellness Visit. I appreciate your ongoing commitment to your health goals. Please review the following plan we discussed and let me know if I can assist you in the future.   These are the goals we discussed:  Goals      Patient Stated     04/25/2020, I will continue to ride my bike 4 days a week for 1 hour.      Patient Stated     04/26/2021, I will continue to walk 3 miles daily.      Patient Stated     05/02/2022, try not to over eat     Patient Stated     Eat healthy        This is a list of the screening recommended for you and due dates:  Health Maintenance  Topic Date Due   Zoster (Shingles) Vaccine (1 of 2) Never done   Colon Cancer Screening  Never done   COVID-19 Vaccine (3 - Pfizer risk series) 12/16/2019   Eye exam for diabetics  10/01/2022   Flu Shot  04/23/2023   Stool Blood Test  04/23/2023   Yearly kidney health urinalysis for diabetes  07/05/2023   Hemoglobin A1C  07/11/2023   Complete foot exam   01/09/2024   Yearly kidney function blood test for diabetes  01/19/2024   Medicare Annual Wellness Visit  03/04/2024   DTaP/Tdap/Td vaccine (3 - Td or Tdap) 10/15/2025   Pneumonia Vaccine  Completed   Hepatitis C Screening  Completed   HPV Vaccine  Aged Out    Advanced directives: Please bring a copy of your health care power of attorney and living will to the office to be added to your chart at your convenience.   Conditions/risks identified: none  Next appointment: Follow up in one year for your annual wellness visit. 03/08/24 @ 11:15 telephone  Preventive Care 65 Years and Older, Male  Preventive care refers to lifestyle choices and visits with your health care provider that can promote health and wellness. What does preventive care include? A yearly physical exam. This is also called an annual well check. Dental exams once or twice a year. Routine eye exams. Ask your health care provider how  often you should have your eyes checked. Personal lifestyle choices, including: Daily care of your teeth and gums. Regular physical activity. Eating a healthy diet. Avoiding tobacco and drug use. Limiting alcohol use. Practicing safe sex. Taking low doses of aspirin every day. Taking vitamin and mineral supplements as recommended by your health care provider. What happens during an annual well check? The services and screenings done by your health care provider during your annual well check will depend on your age, overall health, lifestyle risk factors, and family history of disease. Counseling  Your health care provider may ask you questions about your: Alcohol use. Tobacco use. Drug use. Emotional well-being. Home and relationship well-being. Sexual activity. Eating habits. History of falls. Memory and ability to understand (cognition). Work and work Astronomer. Screening  You may have the following tests or measurements: Height, weight, and BMI. Blood pressure. Lipid and cholesterol levels. These may be checked every 5 years, or more frequently if you are over 37 years old. Skin check. Lung cancer screening. You may have this screening every year starting at age 77 if you have a 30-pack-year history of smoking and currently smoke or have quit within the past 15 years. Fecal occult blood  test (FOBT) of the stool. You may have this test every year starting at age 16. Flexible sigmoidoscopy or colonoscopy. You may have a sigmoidoscopy every 5 years or a colonoscopy every 10 years starting at age 23. Prostate cancer screening. Recommendations will vary depending on your family history and other risks. Hepatitis C blood test. Hepatitis B blood test. Sexually transmitted disease (STD) testing. Diabetes screening. This is done by checking your blood sugar (glucose) after you have not eaten for a while (fasting). You may have this done every 1-3 years. Abdominal aortic aneurysm  (AAA) screening. You may need this if you are a current or former smoker. Osteoporosis. You may be screened starting at age 75 if you are at high risk. Talk with your health care provider about your test results, treatment options, and if necessary, the need for more tests. Vaccines  Your health care provider may recommend certain vaccines, such as: Influenza vaccine. This is recommended every year. Tetanus, diphtheria, and acellular pertussis (Tdap, Td) vaccine. You may need a Td booster every 10 years. Zoster vaccine. You may need this after age 51. Pneumococcal 13-valent conjugate (PCV13) vaccine. One dose is recommended after age 60. Pneumococcal polysaccharide (PPSV23) vaccine. One dose is recommended after age 77. Talk to your health care provider about which screenings and vaccines you need and how often you need them. This information is not intended to replace advice given to you by your health care provider. Make sure you discuss any questions you have with your health care provider. Document Released: 10/05/2015 Document Revised: 05/28/2016 Document Reviewed: 07/10/2015 Elsevier Interactive Patient Education  2017 [employer].  Fall Prevention in the Home Falls can cause injuries. They can happen to people of all ages. There are many things you can do to make your home safe and to help prevent falls. What can I do on the outside of my home? Regularly fix the edges of walkways and driveways and fix any cracks. Remove anything that might make you trip as you walk through a door, such as a raised step or threshold. Trim any bushes or trees on the path to your home. Use bright outdoor lighting. Clear any walking paths of anything that might make someone trip, such as rocks or tools. Regularly check to see if handrails are loose or broken. Make sure that both sides of any steps have handrails. Any raised decks and porches should have guardrails on the edges. Have any leaves, snow, or  ice cleared regularly. Use sand or salt on walking paths during winter. Clean up any spills in your garage right away. This includes oil or grease spills. What can I do in the bathroom? Use night lights. Install grab bars by the toilet and in the tub and shower. Do not use towel bars as grab bars. Use non-skid mats or decals in the tub or shower. If you need to sit down in the shower, use a plastic, non-slip stool. Keep the floor dry. Clean up any water that spills on the floor as soon as it happens. Remove soap buildup in the tub or shower regularly. Attach bath mats securely with double-sided non-slip rug tape. Do not have throw rugs and other things on the floor that can make you trip. What can I do in the bedroom? Use night lights. Make sure that you have a light by your bed that is easy to reach. Do not use any sheets or blankets that are too big for your bed. They should not hang  down onto the floor. Have a firm chair that has side arms. You can use this for support while you get dressed. Do not have throw rugs and other things on the floor that can make you trip. What can I do in the kitchen? Clean up any spills right away. Avoid walking on wet floors. Keep items that you use a lot in easy-to-reach places. If you need to reach something above you, use a strong step stool that has a grab bar. Keep electrical cords out of the way. Do not use floor polish or wax that makes floors slippery. If you must use wax, use non-skid floor wax. Do not have throw rugs and other things on the floor that can make you trip. What can I do with my stairs? Do not leave any items on the stairs. Make sure that there are handrails on both sides of the stairs and use them. Fix handrails that are broken or loose. Make sure that handrails are as long as the stairways. Check any carpeting to make sure that it is firmly attached to the stairs. Fix any carpet that is loose or worn. Avoid having throw rugs at  the top or bottom of the stairs. If you do have throw rugs, attach them to the floor with carpet tape. Make sure that you have a light switch at the top of the stairs and the bottom of the stairs. If you do not have them, ask someone to add them for you. What else can I do to help prevent falls? Wear shoes that: Do not have high heels. Have rubber bottoms. Are comfortable and fit you well. Are closed at the toe. Do not wear sandals. If you use a stepladder: Make sure that it is fully opened. Do not climb a closed stepladder. Make sure that both sides of the stepladder are locked into place. Ask someone to hold it for you, if possible. Clearly mark and make sure that you can see: Any grab bars or handrails. First and last steps. Where the edge of each step is. Use tools that help you move around (mobility aids) if they are needed. These include: Canes. Walkers. Scooters. Crutches. Turn on the lights when you go into a dark area. Replace any light bulbs as soon as they burn out. Set up your furniture so you have a clear path. Avoid moving your furniture around. If any of your floors are uneven, fix them. If there are any pets around you, be aware of where they are. Review your medicines with your doctor. Some medicines can make you feel dizzy. This can increase your chance of falling. Ask your doctor what other things that you can do to help prevent falls. This information is not intended to replace advice given to you by your health care provider. Make sure you discuss any questions you have with your health care provider. Document Released: 07/05/2009 Document Revised: 02/14/2016 Document Reviewed: 10/13/2014 Elsevier Interactive Patient Education  2017 [employer].

## 2023-04-16 ENCOUNTER — Inpatient Hospital Stay: Payer: Medicare HMO | Attending: Oncology

## 2023-04-16 DIAGNOSIS — Z832 Family history of diseases of the blood and blood-forming organs and certain disorders involving the immune mechanism: Secondary | ICD-10-CM | POA: Diagnosis not present

## 2023-04-16 DIAGNOSIS — Z87891 Personal history of nicotine dependence: Secondary | ICD-10-CM | POA: Diagnosis not present

## 2023-04-16 DIAGNOSIS — Z8249 Family history of ischemic heart disease and other diseases of the circulatory system: Secondary | ICD-10-CM | POA: Diagnosis not present

## 2023-04-16 DIAGNOSIS — I10 Essential (primary) hypertension: Secondary | ICD-10-CM | POA: Insufficient documentation

## 2023-04-16 DIAGNOSIS — Z823 Family history of stroke: Secondary | ICD-10-CM | POA: Diagnosis not present

## 2023-04-16 DIAGNOSIS — Z8041 Family history of malignant neoplasm of ovary: Secondary | ICD-10-CM | POA: Insufficient documentation

## 2023-04-16 DIAGNOSIS — Z79899 Other long term (current) drug therapy: Secondary | ICD-10-CM | POA: Diagnosis not present

## 2023-04-16 DIAGNOSIS — R7989 Other specified abnormal findings of blood chemistry: Secondary | ICD-10-CM

## 2023-04-16 LAB — CBC WITH DIFFERENTIAL (CANCER CENTER ONLY)
Abs Immature Granulocytes: 0.02 10*3/uL (ref 0.00–0.07)
Basophils Absolute: 0 10*3/uL (ref 0.0–0.1)
Basophils Relative: 1 %
Eosinophils Absolute: 0.1 10*3/uL (ref 0.0–0.5)
Eosinophils Relative: 2 %
HCT: 44 % (ref 39.0–52.0)
Hemoglobin: 15.6 g/dL (ref 13.0–17.0)
Immature Granulocytes: 0 %
Lymphocytes Relative: 21 %
Lymphs Abs: 1.2 10*3/uL (ref 0.7–4.0)
MCH: 31.7 pg (ref 26.0–34.0)
MCHC: 35.5 g/dL (ref 30.0–36.0)
MCV: 89.4 fL (ref 80.0–100.0)
Monocytes Absolute: 0.5 10*3/uL (ref 0.1–1.0)
Monocytes Relative: 9 %
Neutro Abs: 3.8 10*3/uL (ref 1.7–7.7)
Neutrophils Relative %: 67 %
Platelet Count: 119 10*3/uL — ABNORMAL LOW (ref 150–400)
RBC: 4.92 MIL/uL (ref 4.22–5.81)
RDW: 13 % (ref 11.5–15.5)
WBC Count: 5.6 10*3/uL (ref 4.0–10.5)
nRBC: 0 % (ref 0.0–0.2)

## 2023-04-16 LAB — HEPATIC FUNCTION PANEL
ALT: 27 U/L (ref 0–44)
AST: 18 U/L (ref 15–41)
Albumin: 4.5 g/dL (ref 3.5–5.0)
Alkaline Phosphatase: 60 U/L (ref 38–126)
Bilirubin, Direct: 0.3 mg/dL — ABNORMAL HIGH (ref 0.0–0.2)
Indirect Bilirubin: 1.2 mg/dL — ABNORMAL HIGH (ref 0.3–0.9)
Total Bilirubin: 1.5 mg/dL — ABNORMAL HIGH (ref 0.3–1.2)
Total Protein: 6.9 g/dL (ref 6.5–8.1)

## 2023-04-20 ENCOUNTER — Encounter: Payer: Self-pay | Admitting: Oncology

## 2023-04-20 ENCOUNTER — Inpatient Hospital Stay (HOSPITAL_BASED_OUTPATIENT_CLINIC_OR_DEPARTMENT_OTHER): Payer: Medicare HMO | Admitting: Oncology

## 2023-04-20 VITALS — BP 129/82 | HR 64 | Temp 97.7°F | Resp 18 | Wt 171.3 lb

## 2023-04-20 DIAGNOSIS — Z8249 Family history of ischemic heart disease and other diseases of the circulatory system: Secondary | ICD-10-CM | POA: Diagnosis not present

## 2023-04-20 DIAGNOSIS — R7989 Other specified abnormal findings of blood chemistry: Secondary | ICD-10-CM

## 2023-04-20 DIAGNOSIS — Z87891 Personal history of nicotine dependence: Secondary | ICD-10-CM | POA: Diagnosis not present

## 2023-04-20 DIAGNOSIS — Z148 Genetic carrier of other disease: Secondary | ICD-10-CM | POA: Diagnosis not present

## 2023-04-20 DIAGNOSIS — I10 Essential (primary) hypertension: Secondary | ICD-10-CM | POA: Diagnosis not present

## 2023-04-20 DIAGNOSIS — Z823 Family history of stroke: Secondary | ICD-10-CM | POA: Diagnosis not present

## 2023-04-20 DIAGNOSIS — Z79899 Other long term (current) drug therapy: Secondary | ICD-10-CM | POA: Diagnosis not present

## 2023-04-20 DIAGNOSIS — Z8041 Family history of malignant neoplasm of ovary: Secondary | ICD-10-CM | POA: Diagnosis not present

## 2023-04-20 DIAGNOSIS — Z832 Family history of diseases of the blood and blood-forming organs and certain disorders involving the immune mechanism: Secondary | ICD-10-CM | POA: Diagnosis not present
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
## 2023-04-20 NOTE — Assessment & Plan Note (Addendum)
Labs are reviewed and discussed with patient. Heterozygous hemochomotosis  C282Y Recommend patient to avoid alcohol, iron supplementation, vitamin c supplementation.  Ferritin is <500, isolated iron saturation elevation,  Recommend continue observation  US RUQ for evaluation of liver-patient prefers to defer at this point due to cost concern.  Recommend first-degree relatives to consider screening.

## 2023-04-20 NOTE — Assessment & Plan Note (Signed)
Chronicly elevated total bilirubinemia, normal direct bili,likely Gilbert syndrome. No intervention.

## 2023-06-01 DIAGNOSIS — E119 Type 2 diabetes mellitus without complications: Secondary | ICD-10-CM | POA: Diagnosis not present

## 2023-06-01 DIAGNOSIS — H5213 Myopia, bilateral: Secondary | ICD-10-CM | POA: Diagnosis not present

## 2023-06-01 DIAGNOSIS — H52213 Irregular astigmatism, bilateral: Secondary | ICD-10-CM | POA: Diagnosis not present

## 2023-06-01 DIAGNOSIS — Z0100 Encounter for examination of eyes and vision without abnormal findings: Secondary | ICD-10-CM | POA: Diagnosis not present

## 2023-06-01 DIAGNOSIS — H524 Presbyopia: Secondary | ICD-10-CM | POA: Diagnosis not present

## 2023-06-26 ENCOUNTER — Other Ambulatory Visit: Payer: Self-pay | Admitting: Family Medicine

## 2023-06-26 DIAGNOSIS — Z1211 Encounter for screening for malignant neoplasm of colon: Secondary | ICD-10-CM

## 2023-06-26 DIAGNOSIS — Z1212 Encounter for screening for malignant neoplasm of rectum: Secondary | ICD-10-CM

## 2023-07-06 ENCOUNTER — Other Ambulatory Visit: Payer: Self-pay | Admitting: Family Medicine

## 2023-07-06 DIAGNOSIS — D696 Thrombocytopenia, unspecified: Secondary | ICD-10-CM

## 2023-07-06 DIAGNOSIS — Z1212 Encounter for screening for malignant neoplasm of rectum: Secondary | ICD-10-CM | POA: Diagnosis not present

## 2023-07-06 DIAGNOSIS — E782 Mixed hyperlipidemia: Secondary | ICD-10-CM

## 2023-07-06 DIAGNOSIS — Z1211 Encounter for screening for malignant neoplasm of colon: Secondary | ICD-10-CM | POA: Diagnosis not present

## 2023-07-06 DIAGNOSIS — Z148 Genetic carrier of other disease: Secondary | ICD-10-CM

## 2023-07-06 DIAGNOSIS — E1169 Type 2 diabetes mellitus with other specified complication: Secondary | ICD-10-CM

## 2023-07-06 DIAGNOSIS — N400 Enlarged prostate without lower urinary tract symptoms: Secondary | ICD-10-CM

## 2023-07-07 ENCOUNTER — Other Ambulatory Visit (INDEPENDENT_AMBULATORY_CARE_PROVIDER_SITE_OTHER): Payer: Medicare HMO

## 2023-07-07 DIAGNOSIS — E782 Mixed hyperlipidemia: Secondary | ICD-10-CM | POA: Diagnosis not present

## 2023-07-07 DIAGNOSIS — N400 Enlarged prostate without lower urinary tract symptoms: Secondary | ICD-10-CM | POA: Diagnosis not present

## 2023-07-07 DIAGNOSIS — E1169 Type 2 diabetes mellitus with other specified complication: Secondary | ICD-10-CM

## 2023-07-07 LAB — LIPID PANEL
Cholesterol: 127 mg/dL (ref 0–200)
HDL: 51.5 mg/dL (ref >39.00)
LDL Cholesterol: 58 mg/dL (ref 0–99)
NonHDL: 75
Total CHOL/HDL Ratio: 2
Triglycerides: 84 mg/dL (ref 0.0–149.0)
VLDL: 16.8 mg/dL (ref 0.0–40.0)

## 2023-07-07 LAB — COMPREHENSIVE METABOLIC PANEL
ALT: 22 U/L (ref 0–53)
AST: 15 U/L (ref 0–37)
Albumin: 4.7 g/dL (ref 3.5–5.2)
Alkaline Phosphatase: 66 U/L (ref 39–117)
BUN: 19 mg/dL (ref 6–23)
CO2: 28 meq/L (ref 19–32)
Calcium: 9.8 mg/dL (ref 8.4–10.5)
Chloride: 101 meq/L (ref 96–112)
Creatinine, Ser: 0.94 mg/dL (ref 0.40–1.50)
GFR: 82.19 mL/min (ref >60.00)
Glucose, Bld: 251 mg/dL — ABNORMAL HIGH (ref 70–99)
Potassium: 4.8 meq/L (ref 3.5–5.1)
Sodium: 137 meq/L (ref 135–145)
Total Bilirubin: 2.2 mg/dL — ABNORMAL HIGH (ref 0.2–1.2)
Total Protein: 6.7 g/dL (ref 6.0–8.3)

## 2023-07-07 LAB — FECAL OCCULT BLOOD, GUAIAC: Fecal Occult Blood: NEGATIVE

## 2023-07-07 LAB — MICROALBUMIN / CREATININE URINE RATIO
Creatinine,U: 107.4 mg/dL
Microalb Creat Ratio: 7.4 mg/g (ref 0.0–30.0)
Microalb, Ur: 7.9 mg/dL — ABNORMAL HIGH (ref 0.0–1.9)

## 2023-07-07 LAB — HEMOGLOBIN A1C: Hgb A1c MFr Bld: 7.8 % — ABNORMAL HIGH (ref 4.6–6.5)

## 2023-07-07 LAB — PSA: PSA: 0.5 ng/mL (ref 0.10–4.00)

## 2023-07-12 LAB — COLOGUARD: COLOGUARD: NEGATIVE

## 2023-07-14 ENCOUNTER — Ambulatory Visit (INDEPENDENT_AMBULATORY_CARE_PROVIDER_SITE_OTHER): Payer: Medicare HMO | Admitting: Family Medicine

## 2023-07-14 ENCOUNTER — Encounter: Payer: Self-pay | Admitting: Family Medicine

## 2023-07-14 VITALS — BP 134/80 | HR 70 | Temp 98.2°F | Ht 67.5 in | Wt 169.2 lb

## 2023-07-14 DIAGNOSIS — Z7189 Other specified counseling: Secondary | ICD-10-CM

## 2023-07-14 DIAGNOSIS — E782 Mixed hyperlipidemia: Secondary | ICD-10-CM | POA: Diagnosis not present

## 2023-07-14 DIAGNOSIS — Z148 Genetic carrier of other disease: Secondary | ICD-10-CM

## 2023-07-14 DIAGNOSIS — I10 Essential (primary) hypertension: Secondary | ICD-10-CM

## 2023-07-14 DIAGNOSIS — E041 Nontoxic single thyroid nodule: Secondary | ICD-10-CM | POA: Diagnosis not present

## 2023-07-14 DIAGNOSIS — I2510 Atherosclerotic heart disease of native coronary artery without angina pectoris: Secondary | ICD-10-CM

## 2023-07-14 DIAGNOSIS — E1169 Type 2 diabetes mellitus with other specified complication: Secondary | ICD-10-CM | POA: Diagnosis not present

## 2023-07-14 DIAGNOSIS — Z0000 Encounter for general adult medical examination without abnormal findings: Secondary | ICD-10-CM

## 2023-07-14 MED ORDER — METFORMIN HCL 500 MG PO TABS: 500.0000 mg | ORAL_TABLET | Freq: Every day | ORAL | 3 refills | Status: DC

## 2023-07-14 MED ORDER — METOPROLOL TARTRATE 25 MG PO TABS: 25.0000 mg | ORAL_TABLET | Freq: Two times a day (BID) | ORAL | 4 refills | Status: DC

## 2023-07-14 MED ORDER — LISINOPRIL 40 MG PO TABS
40.0000 mg | ORAL_TABLET | Freq: Every day | ORAL | 4 refills | Status: DC
Start: 2023-07-14 — End: 2024-07-19

## 2023-07-14 MED ORDER — ROSUVASTATIN CALCIUM 20 MG PO TABS: 20.0000 mg | ORAL_TABLET | Freq: Every day | ORAL | 4 refills | Status: DC

## 2023-07-14 NOTE — Assessment & Plan Note (Signed)
Chronic, deteriorated.  Discussed renewed efforts for low sugar low carb diabetic diet.  Will start metformin 500mg  daily.  RTC 3 mo f/u visit.

## 2023-07-14 NOTE — Assessment & Plan Note (Signed)
Advanced directives - working on this. Wife is HCPOA. Would consider CPR but no prolonged life support if terminal condition. Asked to bring [place] a copy when complete. He is organ donor.

## 2023-07-14 NOTE — Progress Notes (Signed)
Ph: [phone] Fax: [phone]   Patient ID: Joseph Shea, male    DOB: [birth year], 72 y.o.   MRN: [id number]  This visit was conducted in person.  BP 134/80   Pulse 70   Temp 98.2 F (36.8 C) (Oral)   Ht 5' 7.5" (1.715 m)   Wt 169 lb 4 oz (76.8 kg)   SpO2 98%   BMI 26.12 kg/m    CC: CPE Subjective:   HPI: Joseph Shea is a 72 y.o. male presenting on 07/14/2023 for Annual Exam [facility] pt 2 [AWV- 03/05/23].)   Saw health advisor 02/2023 for medicare wellness visit. Note reviewed.   No results found.  Flowsheet Row Clinical Support from 03/05/2023 in [facility] [place]  PHQ-2 Total Score 0          03/05/2023   12:59 PM 01/09/2023   10:46 AM 05/02/2022    9:19 AM 04/26/2021   10:34 AM 04/25/2020    9:05 AM  Fall Risk   Falls in the past year? 0 0 0 0 0  Number falls in past yr: 0  0 0 0  Injury with Fall? 0  0 0 0  Risk for fall due to : No Fall Risks  Medication side effect Medication side effect Medication side effect  Follow up Falls prevention discussed;Falls evaluation completed  Falls evaluation completed;Education provided;Falls prevention discussed Falls evaluation completed;Falls prevention discussed Falls evaluation completed;Falls prevention discussed   Recent diagnosis hemochromatosis carrier (HFE C282Y heterozygote). He saw hematology Dr Cathie Hoops - rec avoid alcohol, oral iron, vit C supplement, keep ferritin <500. Rec observation. Consider RUQ [place] - pt declined. Hyperbili thought Gilbert syndrome.   DM - sugars have been elevated to 200s. Worsened diet - increased carbs (pasta and bread).   Preventative: Colon cancer screening - Cologuard normal 06/2023  Prostate cancer screening - nocturia 2x. Continue yearly screen.  Lung cancer screening - not eligible  Flu shot yearly  Tdap 2017  COVID vaccine - Pfizer x2 10/2019  Pneumovax-23 - 04/2019, prevnar-20 04/2021 Shingrix - discussed, to consider  Advanced directives - working on  this. Wife is HCPOA. Would consider CPR but no prolonged life support if terminal condition. Asked to bring [place] a copy when complete. He is organ donor.  Seat belt use discussed Sunscreen use discussed. No changing moles on skin.  Non smoker - wife smokes outside  Alcohol - none  Dentist - yearly  Eye exam - yearly ([facility]) Bowel - no constipation  Bladder - no incontinence   Lives with wife Occupation: retired Dentist - now works at [employer] Activity: rides bike 2-3 x/wk a few miles, tries to walk Diet: fruits/vegetables daily, small amt water     Relevant past medical, surgical, family and social history reviewed and updated as indicated. Interim medical history since our last visit reviewed. Allergies and medications reviewed and updated. Outpatient Medications Prior to Visit  Medication Sig Dispense Refill   aspirin 81 MG tablet Take 1 tablet (81 mg total) by mouth daily. 30 tablet    Blood Glucose Monitoring Suppl (ONE TOUCH ULTRA 2) w/Device KIT Use as instructed to check blood sugar once a day 1 kit 0   glucose blood (ONETOUCH ULTRA) test strip Use as instructed to check blood sugar once a day 100 each 3   Lancets (ONETOUCH DELICA PLUS LANCET33G) MISC Use as instructed to check blood sugar once a day 100 each  3   nitroGLYCERIN (NITROSTAT) 0.4 MG SL tablet Place 1 tablet (0.4 mg total) under the tongue every 5 (five) minutes as needed. Reported on 10/16/2015 30 tablet 1   lisinopril (ZESTRIL) 40 MG tablet TAKE 1 TABLET BY MOUTH EVERY DAY 90 tablet 2   metoprolol tartrate (LOPRESSOR) 25 MG tablet TAKE 1 TABLET BY MOUTH TWICE A DAY 180 tablet 3   rosuvastatin (CRESTOR) 20 MG tablet TAKE 1 TABLET BY MOUTH EVERY DAY 90 tablet 3   No facility-administered medications prior to visit.     Per HPI unless specifically indicated in ROS section below Review of Systems  Constitutional:  Negative for activity change, appetite change,  chills, fatigue, fever and unexpected weight change.  HENT:  Negative for hearing loss.   Eyes:  Negative for visual disturbance.  Respiratory:  Negative for cough, chest tightness, shortness of breath and wheezing.   Cardiovascular:  Negative for chest pain, palpitations and leg swelling.  Gastrointestinal:  Negative for abdominal distention, abdominal pain, blood in stool, constipation, diarrhea, nausea and vomiting.  Genitourinary:  Negative for difficulty urinating and hematuria.  Musculoskeletal:  Negative for arthralgias, myalgias and neck pain.  Skin:  Negative for rash.  Neurological:  Negative for dizziness, seizures, syncope and headaches.  Hematological:  Negative for adenopathy. Does not bruise/bleed easily.  Psychiatric/Behavioral:  Negative for dysphoric mood. The patient is not nervous/anxious.     Objective:  BP 134/80   Pulse 70   Temp 98.2 F (36.8 C) (Oral)   Ht 5' 7.5" (1.715 m)   Wt 169 lb 4 oz (76.8 kg)   SpO2 98%   BMI 26.12 kg/m   Wt Readings from Last 3 Encounters:  07/14/23 169 lb 4 oz (76.8 kg)  04/20/23 171 lb 4.8 oz (77.7 kg)  03/05/23 166 lb (75.3 kg)      Physical Exam Vitals and nursing note reviewed.  Constitutional:      General: He is not in acute distress.    Appearance: Normal appearance. He is well-developed. He is not ill-appearing.  HENT:     Head: Normocephalic and atraumatic.     Right Ear: Hearing, tympanic membrane, ear canal and external ear normal.     Left Ear: Hearing, tympanic membrane, ear canal and external ear normal.     Nose: Nose normal.     Mouth/Throat:     Mouth: Mucous membranes are moist.     Pharynx: Oropharynx is clear. No oropharyngeal exudate or posterior oropharyngeal erythema.  Eyes:     General: No scleral icterus.    Extraocular Movements: Extraocular movements intact.     Conjunctiva/sclera: Conjunctivae normal.     Pupils: Pupils are equal, round, and reactive to light.  Neck:     Thyroid: Thyroid  mass (R nodule) present. No thyromegaly.     Vascular: No carotid bruit.     Comments: Reviewed thyroid US from 2021 - no nodules Cardiovascular:     Rate and Rhythm: Normal rate and regular rhythm.     Pulses: Normal pulses.          Radial pulses are 2+ on the right side and 2+ on the left side.     Heart sounds: Normal heart sounds. No murmur heard. Pulmonary:     Effort: Pulmonary effort is normal. No respiratory distress.     Breath sounds: Normal breath sounds. No wheezing, rhonchi or rales.  Abdominal:     General: Bowel sounds are normal. There is no distension.  Palpations: Abdomen is soft. There is no mass.     Tenderness: There is no abdominal tenderness. There is no guarding or rebound.     Hernia: No hernia is present.  Musculoskeletal:        General: Normal range of motion.     Cervical back: Normal range of motion and neck supple.     Right lower leg: No edema.     Left lower leg: No edema.  Lymphadenopathy:     Cervical: No cervical adenopathy.  Skin:    General: Skin is warm and dry.     Findings: No rash.  Neurological:     General: No focal deficit present.     Mental Status: He is alert and oriented to person, place, and time.  Psychiatric:        Mood and Affect: Mood normal.        Behavior: Behavior normal.        Thought Content: Thought content normal.        Judgment: Judgment normal.       Results for orders placed or performed in visit on 07/07/23  PSA  Result Value Ref Range   PSA 0.50 0.10 - 4.00 ng/mL  Microalbumin / creatinine urine ratio  Result Value Ref Range   Microalb, Ur 7.9 (H) 0.0 - 1.9 mg/dL   Creatinine,U 130.8 mg/dL   Microalb Creat Ratio 7.4 0.0 - 30.0 mg/g  Hemoglobin A1c  Result Value Ref Range   Hgb A1c MFr Bld 7.8 (H) 4.6 - 6.5 %  Comprehensive metabolic panel  Result Value Ref Range   Sodium 137 135 - 145 mEq/L   Potassium 4.8 3.5 - 5.1 mEq/L   Chloride 101 96 - 112 mEq/L   CO2 28 19 - 32 mEq/L   Glucose, Bld  251 (H) 70 - 99 mg/dL   BUN 19 6 - 23 mg/dL   Creatinine, Ser 6.57 0.40 - 1.50 mg/dL   Total Bilirubin 2.2 (H) 0.2 - 1.2 mg/dL   Alkaline Phosphatase 66 39 - 117 U/L   AST 15 0 - 37 U/L   ALT 22 0 - 53 U/L   Total Protein 6.7 6.0 - 8.3 g/dL   Albumin 4.7 3.5 - 5.2 g/dL   GFR 84.69 >62.95 mL/min   Calcium 9.8 8.4 - 10.5 mg/dL  Lipid panel  Result Value Ref Range   Cholesterol 127 0 - 200 mg/dL   Triglycerides 28.4 0.0 - 149.0 mg/dL   HDL 13.24 >40.10 mg/dL   VLDL 27.2 0.0 - 53.6 mg/dL   LDL Cholesterol 58 0 - 99 mg/dL   Total CHOL/HDL Ratio 2    NonHDL 75.00    Lab Results  Component Value Date   TSH 2.00 06/21/2012    Assessment & Plan:   Problem List Items Addressed This Visit     Healthcare maintenance - Primary (Chronic)    Preventative protocols reviewed and updated unless pt declined. Discussed healthy diet and lifestyle.       Advanced care planning/counseling discussion (Chronic)    Advanced directives - working on this. Wife is HCPOA. Would consider CPR but no prolonged life support if terminal condition. Asked to bring [place] a copy when complete. He is organ donor.       Hemochromatosis carrier (Chronic)    Appreciate heme care.      Mixed hyperlipidemia    Chronic, stable on crestor 20mg  daily - continue. The ASCVD Risk score (Arnett DK, et al., 2019)  failed to calculate for the following reasons:   The valid total cholesterol range is 130 to 320 mg/dL       Relevant Medications   lisinopril (ZESTRIL) 40 MG tablet   metoprolol tartrate (LOPRESSOR) 25 MG tablet   rosuvastatin (CRESTOR) 20 MG tablet   HYPERTENSION, BENIGN ESSENTIAL    Chronic, stable on current regimen.       Relevant Medications   lisinopril (ZESTRIL) 40 MG tablet   metoprolol tartrate (LOPRESSOR) 25 MG tablet   rosuvastatin (CRESTOR) 20 MG tablet   Coronary atherosclerosis    Sees cardiologist - s/p stents.      Relevant Medications   lisinopril (ZESTRIL) 40 MG tablet    metoprolol tartrate (LOPRESSOR) 25 MG tablet   rosuvastatin (CRESTOR) 20 MG tablet   Hyperbilirubinemia    Thought Gilbert's syndrome.       Type 2 diabetes mellitus with other specified complication (HCC)    Chronic, deteriorated.  Discussed renewed efforts for low sugar low carb diabetic diet.  Will start metformin 500mg  daily.  RTC 3 mo f/u visit.       Relevant Medications   lisinopril (ZESTRIL) 40 MG tablet   rosuvastatin (CRESTOR) 20 MG tablet   metFORMIN (GLUCOPHAGE) 500 MG tablet   Thyroid nodule    Again noted today. Reviewed thyroid US from 04/2020 - no nodules. Pt asxs. Consider TSH next labs.       Relevant Medications   metoprolol tartrate (LOPRESSOR) 25 MG tablet     Meds ordered this encounter  Medications   lisinopril (ZESTRIL) 40 MG tablet    Sig: Take 1 tablet (40 mg total) by mouth daily.    Dispense:  90 tablet    Refill:  4   metoprolol tartrate (LOPRESSOR) 25 MG tablet    Sig: Take 1 tablet (25 mg total) by mouth 2 (two) times daily.    Dispense:  180 tablet    Refill:  4   rosuvastatin (CRESTOR) 20 MG tablet    Sig: Take 1 tablet (20 mg total) by mouth daily.    Dispense:  90 tablet    Refill:  4   metFORMIN (GLUCOPHAGE) 500 MG tablet    Sig: Take 1 tablet (500 mg total) by mouth daily with breakfast.    Dispense:  90 tablet    Refill:  3    No orders of the defined types were placed in this encounter.   Patient Instructions  Get flu shot at local pharmacy  If interested, check with pharmacy about new 2 shot shingles series (shingrix).  Start metformin 500mg  daily. Work on low sugar low carb diabetic diet. Bring [place] copy of advanced directive when complete.  Return in 3-4 months for follow up visit on diabetes  Follow up plan: Return in about 3 months (around 10/14/2023) for follow up visit.  Eustaquio Boyden, MD

## 2023-07-14 NOTE — Assessment & Plan Note (Signed)
Chronic, stable on crestor '20mg'$  daily - continue. ?The ASCVD Risk score (Arnett DK, et al., 2019) failed to calculate for the following reasons: ?  The valid total cholesterol range is 130 to 320 mg/dL  ?

## 2023-07-14 NOTE — Assessment & Plan Note (Signed)
Thought Gilbert's syndrome.

## 2023-07-14 NOTE — Patient Instructions (Addendum)
Get flu shot at local pharmacy  If interested, check with pharmacy about new 2 shot shingles series (shingrix).  Start metformin 500mg  daily. Work on low sugar low carb diabetic diet. Bring [place] copy of advanced directive when complete.  Return in 3-4 months for follow up visit on diabetes

## 2023-07-14 NOTE — Assessment & Plan Note (Signed)
Appreciate heme care.

## 2023-07-14 NOTE — Assessment & Plan Note (Signed)
Chronic, stable on current regimen.  

## 2023-07-14 NOTE — Assessment & Plan Note (Signed)
Sees cardiologist - s/p stents.

## 2023-07-14 NOTE — Assessment & Plan Note (Signed)
Again noted today. Reviewed thyroid US from 04/2020 - no nodules. Pt asxs. Consider TSH next labs.

## 2023-07-14 NOTE — Assessment & Plan Note (Signed)
Preventative protocols reviewed and updated unless pt declined. Discussed healthy diet and lifestyle.  

## 2023-07-17 ENCOUNTER — Encounter: Payer: Self-pay | Admitting: Family Medicine

## 2023-07-22 ENCOUNTER — Encounter: Payer: Self-pay | Admitting: Family Medicine

## 2023-07-22 ENCOUNTER — Ambulatory Visit (INDEPENDENT_AMBULATORY_CARE_PROVIDER_SITE_OTHER): Payer: Medicare HMO | Admitting: Family Medicine

## 2023-07-22 ENCOUNTER — Telehealth: Payer: Self-pay | Admitting: Family Medicine

## 2023-07-22 VITALS — BP 122/78 | HR 72 | Temp 97.6°F | Ht 67.5 in | Wt 169.0 lb

## 2023-07-22 DIAGNOSIS — E1169 Type 2 diabetes mellitus with other specified complication: Secondary | ICD-10-CM

## 2023-07-22 DIAGNOSIS — I25118 Atherosclerotic heart disease of native coronary artery with other forms of angina pectoris: Secondary | ICD-10-CM | POA: Diagnosis not present

## 2023-07-22 DIAGNOSIS — Z7984 Long term (current) use of oral hypoglycemic drugs: Secondary | ICD-10-CM | POA: Diagnosis not present

## 2023-07-22 MED ORDER — EMPAGLIFLOZIN 10 MG PO TABS: 10.0000 mg | ORAL_TABLET | Freq: Every day | ORAL | 11 refills | Status: DC

## 2023-07-22 NOTE — Telephone Encounter (Signed)
Noted. Thanks. See OV note.  

## 2023-07-22 NOTE — Progress Notes (Signed)
Patient ID: Joseph Shea, male    DOB: [birth year], 72 y.o.   MRN: [id number]  This visit was conducted in person.  BP 122/78 (BP Location: Left Arm, Patient Position: Sitting, Cuff Size: Normal)   Pulse 72   Temp 97.6 F (36.4 C) (Oral)   Ht 5' 7.5" (1.715 m)   Wt 169 lb (76.7 kg)   SpO2 98%   BMI 26.08 kg/m    CC:  Chief Complaint  Patient presents with   Hyperglycemia    Patient can not tolerate metformin and has been off for 1 day. BS has been in 200-300's.    Subjective:   HPI: Joseph Shea is a 72 y.o. male patient of Dr. Sharen Hones with diabetes, poorly controlled presenting on 07/22/2023 for Hyperglycemia (Patient can not tolerate metformin and has been off for 1 day. BS has been in 200-300's.)   Reviewed recent office visit from July 14, 2023 with PCP At that point patient had reported blood sugars elevated to the 200s.  There had been a worsening of diet with increased carbs, Pasta and bread.  Started on metformin 500 mg daily.... he reports he had significant stomach ache, keeping him awake at night.   Extreme gas and bloating.  Has been taking for the last week.   FBS today 327 initially , now 207.  He is feeling fatigued, " not sharp".  He has not been eating or drinking much in last few days... trying to eat lower carb, avoiding sweets. Now avoiding juice.  Does not drink much water.   He is not interested in injectable medication.   Lab Results  Component Value Date   HGBA1C 7.8 (H) 07/07/2023    Patient also has a history of coronary artery disease, S/P stent     Relevant past medical, surgical, family and social history reviewed and updated as indicated. Interim medical history since our last visit reviewed. Allergies and medications reviewed and updated. Outpatient Medications Prior to Visit  Medication Sig Dispense Refill   aspirin 81 MG tablet Take 1 tablet (81 mg total) by mouth daily. 30 tablet    Blood Glucose Monitoring Suppl (ONE  TOUCH ULTRA 2) w/Device KIT Use as instructed to check blood sugar once a day 1 kit 0   glucose blood (ONETOUCH ULTRA) test strip Use as instructed to check blood sugar once a day 100 each 3   Lancets (ONETOUCH DELICA PLUS LANCET33G) MISC Use as instructed to check blood sugar once a day 100 each 3   lisinopril (ZESTRIL) 40 MG tablet Take 1 tablet (40 mg total) by mouth daily. 90 tablet 4   metFORMIN (GLUCOPHAGE) 500 MG tablet Take 1 tablet (500 mg total) by mouth daily with breakfast. 90 tablet 3   metoprolol tartrate (LOPRESSOR) 25 MG tablet Take 1 tablet (25 mg total) by mouth 2 (two) times daily. 180 tablet 4   nitroGLYCERIN (NITROSTAT) 0.4 MG SL tablet Place 1 tablet (0.4 mg total) under the tongue every 5 (five) minutes as needed. Reported on 10/16/2015 30 tablet 1   rosuvastatin (CRESTOR) 20 MG tablet Take 1 tablet (20 mg total) by mouth daily. 90 tablet 4   No facility-administered medications prior to visit.     Per HPI unless specifically indicated in ROS section below Review of Systems  Constitutional:  Negative for fatigue and fever.  HENT:  Negative for ear pain.   Eyes:  Negative for pain.  Respiratory:  Negative for cough and shortness of breath.  Cardiovascular:  Negative for chest pain, palpitations and leg swelling.  Gastrointestinal:  Negative for abdominal pain.  Genitourinary:  Negative for dysuria.  Musculoskeletal:  Negative for arthralgias.  Neurological:  Negative for syncope, light-headedness and headaches.  Psychiatric/Behavioral:  Negative for dysphoric mood.    Objective:  BP 122/78 (BP Location: Left Arm, Patient Position: Sitting, Cuff Size: Normal)   Pulse 72   Temp 97.6 F (36.4 C) (Oral)   Ht 5' 7.5" (1.715 m)   Wt 169 lb (76.7 kg)   SpO2 98%   BMI 26.08 kg/m   Wt Readings from Last 3 Encounters:  07/22/23 169 lb (76.7 kg)  07/14/23 169 lb 4 oz (76.8 kg)  04/20/23 171 lb 4.8 oz (77.7 kg)      Physical Exam Constitutional:      General: He  is not in acute distress.    Appearance: Normal appearance. He is well-developed. He is not toxic-appearing.  HENT:     Head: Normocephalic.     Right Ear: Hearing normal.     Left Ear: Hearing normal.  Neck:     Thyroid: No thyroid mass or thyromegaly.     Vascular: No carotid bruit.     Trachea: Trachea normal.  Cardiovascular:     Rate and Rhythm: Normal rate and regular rhythm.     Pulses: Normal pulses.     Heart sounds: Heart sounds not distant. No murmur heard.    No friction rub. No gallop.     Comments: No peripheral edema Pulmonary:     Effort: Pulmonary effort is normal. No respiratory distress.     Breath sounds: Normal breath sounds.  Skin:    General: Skin is warm and dry.     Findings: No rash.  Neurological:     Mental Status: He is alert.  Psychiatric:        Speech: Speech normal.        Behavior: Behavior normal.        Thought Content: Thought content normal.       Results for orders placed or performed in visit on 07/17/23  Fecal Occult Blood, Guaiac  Result Value Ref Range   Fecal Occult Blood Negative     Assessment and Plan  Type 2 diabetes mellitus with other specified complication, without long-term current use of insulin (HCC) Assessment & Plan: Chronic, inadequate control.  He was intolerant of metformin low dose. We discussed medication treatment options in detail.  He is not interested in injectable medications given fear/discomfort with blood draws. Given his coronary artery disease status post stents I think he would be a good candidate for Jardiance.  He has no contraindications such as renal issues or history of groin infection. We will start Jardiance 10 mg p.o. daily. He will increase his water intake and continue to work on low carbohydrate diet.  If blood sugars continuing to remain elevated despite initiation of Jardiance he will call in the next week or 2 for possible increase in dose.  Reviewed possible side effects of SLGT2i  with patient in detail.  Return and ER precautions provided    Atherosclerosis of native coronary artery of native heart with stable angina pectoris (HCC)  Other orders -     Empagliflozin; Take 1 tablet (10 mg total) by mouth daily before breakfast.  Dispense: 30 tablet; Refill: 11    No follow-ups on file.   Kerby Nora, MD

## 2023-07-22 NOTE — Telephone Encounter (Signed)
FYI: This call has been transferred to Access Nurse. Once the result note has been entered staff can address the message at that time.  Patient called in with the following symptoms:  Red Word: elevated blood sugar Patient called in and stated that last night his blood sugar was 294 and he was experiencing some blurred vision. He stated that this morning his blood sugar was 327. He stated that he was prescribed metFORMIN (GLUCOPHAGE) 500 MG tablet about a week ago and since taking it he has had issues sleeping and some stomach issues. He did state that he didn't take it last night and only ate an avocado. He was wanting to know if taking in the morning would be better, should he stop taking it altogether and stick to a strict diet.   Please advise at [facility] [phone]  Message is routed to Provider Pool and [facility] Triage

## 2023-07-22 NOTE — Telephone Encounter (Signed)
See note below access note also.

## 2023-07-22 NOTE — Assessment & Plan Note (Addendum)
Chronic, inadequate control.  He was intolerant of metformin low dose. We discussed medication treatment options in detail.  He is not interested in injectable medications given fear/discomfort with blood draws. Given his coronary artery disease status post stents I think he would be a good candidate for Jardiance.  He has no contraindications such as renal issues or history of groin infection. We will start Jardiance 10 mg p.o. daily. He will increase his water intake and continue to work on low carbohydrate diet.  If blood sugars continuing to remain elevated despite initiation of Jardiance he will call in the next week or 2 for possible increase in dose.  Reviewed possible side effects of SLGT2i with patient in detail.  Return and ER precautions provided

## 2023-07-22 NOTE — Telephone Encounter (Signed)
Pt was seen 07/14/23 and started on metformin 500 mg at supper. Pt said since starting metformin FBS has been going up. Pt has not been able to sleep, pt very nauseated and stomach in knots. Pt has not vomited and no diarrhea. Pt is going out of state to [facility] 07/23/23  and returning home sometime on 07/28/23.pt said he is going to watch his diet more closely but pt is going to be where there is a lot of "good food" and pts FBS this AM was 327 which pt said is the highest his BS has been.pt has not taken metformin since 10/285/24 and pt slept better last night being off metformin Dr Reece Agar is out of office and pt is not on any diabetic med since stopping metformin. Pt scheduled appt with Dr Ermalene Searing 07/22/23 at 11:40 with UC & ED precautions and pt voiced understanding. Pt is going to drink more water also.Sending note to Dr Ermalene Searing and Lorain Childes to Dr Reece Agar who is out of office and  Dr Para March

## 2023-07-27 ENCOUNTER — Encounter: Payer: Self-pay | Admitting: Family Medicine

## 2023-07-27 ENCOUNTER — Other Ambulatory Visit: Payer: Self-pay | Admitting: Family Medicine

## 2023-10-12 ENCOUNTER — Ambulatory Visit (INDEPENDENT_AMBULATORY_CARE_PROVIDER_SITE_OTHER): Payer: HMO | Admitting: Family Medicine

## 2023-10-12 ENCOUNTER — Encounter: Payer: Self-pay | Admitting: Family Medicine

## 2023-10-12 VITALS — BP 126/78 | HR 57 | Temp 98.0°F | Ht 67.5 in | Wt 168.4 lb

## 2023-10-12 DIAGNOSIS — Z7984 Long term (current) use of oral hypoglycemic drugs: Secondary | ICD-10-CM

## 2023-10-12 DIAGNOSIS — Z148 Genetic carrier of other disease: Secondary | ICD-10-CM

## 2023-10-12 DIAGNOSIS — E1169 Type 2 diabetes mellitus with other specified complication: Secondary | ICD-10-CM | POA: Diagnosis not present

## 2023-10-12 LAB — POCT GLYCOSYLATED HEMOGLOBIN (HGB A1C): Hemoglobin A1C: 7.1 % — AB (ref 4.0–5.6)

## 2023-10-12 NOTE — Patient Instructions (Addendum)
Continue jardiance 10mg  daily for diabetes. Continue working on low sugar low carb diabetic diet  Diabetes and self-care handout provided today.  Let me know if interested in diabetes education classes at [facility].  Return in 4 months for diabetes follow up

## 2023-10-12 NOTE — Assessment & Plan Note (Signed)
Appreciate heme care - upcoming appt

## 2023-10-12 NOTE — Assessment & Plan Note (Addendum)
Chronic, improved control on Jardiance but above goal (<7%, ideally <6.5%).  He declines increased Jardiance dose at this time.  Encouraged continued healthy diet and following diabetic diet.  Diabetes self-care handout provided.

## 2023-10-12 NOTE — Progress Notes (Signed)
Ph: [phone] Fax: [phone]   Patient ID: Joseph Shea, male    DOB: 1951/07/01, 73 y.o.   MRN: [id number]  This visit was conducted in person.  BP 126/78   Pulse (!) 57   Temp 98 F (36.7 C) (Oral)   Ht 5' 7.5" (1.715 m)   Wt 168 lb 6 oz (76.4 kg)   SpO2 99%   BMI 25.98 kg/m    CC: 72mo DM f/u visit  Subjective:   HPI: Joseph Shea is a 73 y.o. male presenting on 10/12/2023 for Medical Management of Chronic Issues (Here for 3-4 mo DM f/u.)   Hemochromatosis carrier - saw heme Dr Cathie Hoops. Rec ferritin levels <500.   DM - does regularly check sugars - 173 fasting this morning - 156, 177, 186, 166, 168, 158 all fasting. Brings meter data which was reviewed. Compliant with antihyperglycemic regimen which includes: jardiance 10mg  daily. Metformin intolerance - GI upset. He declined injectable medication. Denies low sugars or hypoglycemic symptoms. Denies paresthesias, blurry vision. Last diabetic eye exam 05/2023. Glucometer brand: one touch ultra. Last foot exam: 12/2022. DSME: declined.  Lab Results  Component Value Date   HGBA1C 7.1 (A) 10/12/2023   Diabetic Foot Exam - Simple   Simple Foot Form Diabetic Foot exam was performed with the following findings: Yes 10/12/2023  9:21 AM  Visual Inspection No deformities, no ulcerations, no other skin breakdown bilaterally: Yes Sensation Testing Intact to touch and monofilament testing bilaterally: Yes Pulse Check Posterior Tibialis and Dorsalis pulse intact bilaterally: Yes Comments No claudication    Lab Results  Component Value Date   MICROALBUR 7.9 (H) 07/07/2023         Relevant past medical, surgical, family and social history reviewed and updated as indicated. Interim medical history since our last visit reviewed. Allergies and medications reviewed and updated. Outpatient Medications Prior to Visit  Medication Sig Dispense Refill   aspirin 81 MG tablet Take 1 tablet (81 mg total) by mouth daily. 30 tablet     Blood Glucose Monitoring Suppl (ONE TOUCH ULTRA 2) w/Device KIT Use as instructed to check blood sugar once a day 1 kit 0   empagliflozin (JARDIANCE) 10 MG TABS tablet Take 1 tablet (10 mg total) by mouth daily before breakfast. 30 tablet 11   glucose blood (ONETOUCH ULTRA) test strip Use as instructed to check blood sugar once a day 100 each 3   Lancets (ONETOUCH DELICA PLUS LANCET33G) MISC Use as instructed to check blood sugar once a day 100 each 3   lisinopril (ZESTRIL) 40 MG tablet Take 1 tablet (40 mg total) by mouth daily. 90 tablet 4   metoprolol tartrate (LOPRESSOR) 25 MG tablet Take 1 tablet (25 mg total) by mouth 2 (two) times daily. 180 tablet 4   nitroGLYCERIN (NITROSTAT) 0.4 MG SL tablet Place 1 tablet (0.4 mg total) under the tongue every 5 (five) minutes as needed. Reported on 10/16/2015 30 tablet 1   rosuvastatin (CRESTOR) 20 MG tablet Take 1 tablet (20 mg total) by mouth daily. 90 tablet 4   No facility-administered medications prior to visit.     Per HPI unless specifically indicated in ROS section below Review of Systems  Objective:  BP 126/78   Pulse (!) 57   Temp 98 F (36.7 C) (Oral)   Ht 5' 7.5" (1.715 m)   Wt 168 lb 6 oz (76.4 kg)   SpO2 99%   BMI 25.98 kg/m   Wt Readings from Last 3 Encounters:  10/12/23 168 lb 6 oz (76.4 kg)  07/22/23 169 lb (76.7 kg)  07/14/23 169 lb 4 oz (76.8 kg)      Physical Exam Vitals and nursing note reviewed.  Constitutional:      Appearance: Normal appearance. He is not ill-appearing.  Eyes:     Extraocular Movements: Extraocular movements intact.     Conjunctiva/sclera: Conjunctivae normal.     Pupils: Pupils are equal, round, and reactive to light.  Cardiovascular:     Rate and Rhythm: Normal rate and regular rhythm.     Pulses: Normal pulses.     Heart sounds: Normal heart sounds. No murmur heard. Pulmonary:     Effort: Pulmonary effort is normal. No respiratory distress.     Breath sounds: Normal breath sounds.  No wheezing, rhonchi or rales.  Musculoskeletal:     Right lower leg: No edema.     Left lower leg: No edema.     Comments: See HPI for foot exam if done  Skin:    General: Skin is warm and dry.     Findings: No rash.  Neurological:     Mental Status: He is alert.  Psychiatric:        Mood and Affect: Mood normal.        Behavior: Behavior normal.       Results for orders placed or performed in visit on 10/12/23  POCT glycosylated hemoglobin (Hb A1C)   Collection Time: 10/12/23  9:08 AM  Result Value Ref Range   Hemoglobin A1C 7.1 (A) 4.0 - 5.6 %   HbA1c POC (<> result, manual entry)     HbA1c, POC (prediabetic range)     HbA1c, POC (controlled diabetic range)      Assessment & Plan:   Problem List Items Addressed This Visit     Hemochromatosis carrier (Chronic)   Appreciate heme care - upcoming appt       Type 2 diabetes mellitus with other specified complication (HCC) - Primary   Chronic, improved control on Jardiance but above goal (<7%, ideally <6.5%).  He declines increased Jardiance dose at this time.  Encouraged continued healthy diet and following diabetic diet.  Diabetes self-care handout provided.       Relevant Orders   POCT glycosylated hemoglobin (Hb A1C) (Completed)     No orders of the defined types were placed in this encounter.   Orders Placed This Encounter  Procedures   POCT glycosylated hemoglobin (Hb A1C)    Patient Instructions  Continue jardiance 10mg  daily for diabetes. Continue working on low sugar low carb diabetic diet  Diabetes and self-care handout provided today.  Let me know if interested in diabetes education classes at [facility].  Return in 4 months for diabetes follow up   Follow up plan: Return in about 4 months (around 02/09/2024), or if symptoms worsen or fail to improve, for follow up visit.  Eustaquio Boyden, MD

## 2023-10-19 ENCOUNTER — Inpatient Hospital Stay: Payer: HMO | Attending: Oncology

## 2023-10-19 ENCOUNTER — Ambulatory Visit: Payer: Medicare HMO | Admitting: Cardiovascular Disease

## 2023-10-19 DIAGNOSIS — E785 Hyperlipidemia, unspecified: Secondary | ICD-10-CM | POA: Diagnosis not present

## 2023-10-19 DIAGNOSIS — I10 Essential (primary) hypertension: Secondary | ICD-10-CM | POA: Diagnosis not present

## 2023-10-19 DIAGNOSIS — Z832 Family history of diseases of the blood and blood-forming organs and certain disorders involving the immune mechanism: Secondary | ICD-10-CM | POA: Diagnosis not present

## 2023-10-19 DIAGNOSIS — R7989 Other specified abnormal findings of blood chemistry: Secondary | ICD-10-CM

## 2023-10-19 DIAGNOSIS — Z87891 Personal history of nicotine dependence: Secondary | ICD-10-CM | POA: Diagnosis not present

## 2023-10-19 DIAGNOSIS — I2510 Atherosclerotic heart disease of native coronary artery without angina pectoris: Secondary | ICD-10-CM | POA: Diagnosis not present

## 2023-10-19 DIAGNOSIS — Z8041 Family history of malignant neoplasm of ovary: Secondary | ICD-10-CM | POA: Insufficient documentation

## 2023-10-19 DIAGNOSIS — Z8249 Family history of ischemic heart disease and other diseases of the circulatory system: Secondary | ICD-10-CM | POA: Diagnosis not present

## 2023-10-19 DIAGNOSIS — D751 Secondary polycythemia: Secondary | ICD-10-CM | POA: Insufficient documentation

## 2023-10-19 DIAGNOSIS — Z79899 Other long term (current) drug therapy: Secondary | ICD-10-CM | POA: Diagnosis not present

## 2023-10-19 DIAGNOSIS — Z808 Family history of malignant neoplasm of other organs or systems: Secondary | ICD-10-CM | POA: Diagnosis not present

## 2023-10-19 DIAGNOSIS — Z823 Family history of stroke: Secondary | ICD-10-CM | POA: Insufficient documentation

## 2023-10-19 LAB — CBC WITH DIFFERENTIAL (CANCER CENTER ONLY)
Abs Immature Granulocytes: 0.02 10*3/uL (ref 0.00–0.07)
Basophils Absolute: 0 10*3/uL (ref 0.0–0.1)
Basophils Relative: 1 %
Eosinophils Absolute: 0.1 10*3/uL (ref 0.0–0.5)
Eosinophils Relative: 1 %
HCT: 50.1 % (ref 39.0–52.0)
Hemoglobin: 18.1 g/dL — ABNORMAL HIGH (ref 13.0–17.0)
Immature Granulocytes: 0 %
Lymphocytes Relative: 20 %
Lymphs Abs: 1.3 10*3/uL (ref 0.7–4.0)
MCH: 31.8 pg (ref 26.0–34.0)
MCHC: 36.1 g/dL — ABNORMAL HIGH (ref 30.0–36.0)
MCV: 88 fL (ref 80.0–100.0)
Monocytes Absolute: 0.6 10*3/uL (ref 0.1–1.0)
Monocytes Relative: 9 %
Neutro Abs: 4.6 10*3/uL (ref 1.7–7.7)
Neutrophils Relative %: 69 %
Platelet Count: 123 10*3/uL — ABNORMAL LOW (ref 150–400)
RBC: 5.69 MIL/uL (ref 4.22–5.81)
RDW: 12.7 % (ref 11.5–15.5)
WBC Count: 6.6 10*3/uL (ref 4.0–10.5)
nRBC: 0 % (ref 0.0–0.2)

## 2023-10-19 LAB — IRON AND TIBC
Iron: 129 ug/dL (ref 45–182)
Saturation Ratios: 33 % (ref 17.9–39.5)
TIBC: 389 ug/dL (ref 250–450)
UIBC: 260 ug/dL

## 2023-10-19 LAB — HEPATIC FUNCTION PANEL
ALT: 32 U/L (ref 0–44)
AST: 17 U/L (ref 15–41)
Albumin: 5.1 g/dL — ABNORMAL HIGH (ref 3.5–5.0)
Alkaline Phosphatase: 63 U/L (ref 38–126)
Bilirubin, Direct: 0.2 mg/dL (ref 0.0–0.2)
Indirect Bilirubin: 1.4 mg/dL — ABNORMAL HIGH (ref 0.3–0.9)
Total Bilirubin: 1.6 mg/dL — ABNORMAL HIGH (ref 0.0–1.2)
Total Protein: 7.5 g/dL (ref 6.5–8.1)

## 2023-10-19 LAB — BASIC METABOLIC PANEL - CANCER CENTER ONLY
Anion gap: 9 (ref 5–15)
BUN: 25 mg/dL — ABNORMAL HIGH (ref 8–23)
CO2: 26 mmol/L (ref 22–32)
Calcium: 9.7 mg/dL (ref 8.9–10.3)
Chloride: 100 mmol/L (ref 98–111)
Creatinine: 0.91 mg/dL (ref 0.61–1.24)
GFR, Estimated: >60 mL/min (ref >60)
Glucose, Bld: 151 mg/dL — ABNORMAL HIGH (ref 70–99)
Potassium: 4.5 mmol/L (ref 3.5–5.1)
Sodium: 135 mmol/L (ref 135–145)

## 2023-10-19 LAB — FERRITIN: Ferritin: 265 ng/mL (ref 24–336)

## 2023-10-22 ENCOUNTER — Inpatient Hospital Stay: Payer: HMO | Admitting: Oncology

## 2023-10-22 ENCOUNTER — Encounter: Payer: Self-pay | Admitting: Oncology

## 2023-10-22 VITALS — BP 125/78 | HR 60 | Temp 96.7°F | Resp 18 | Wt 168.0 lb

## 2023-10-22 DIAGNOSIS — Z148 Genetic carrier of other disease: Secondary | ICD-10-CM | POA: Diagnosis not present

## 2023-10-22 DIAGNOSIS — D751 Secondary polycythemia: Secondary | ICD-10-CM
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
## 2023-10-22 NOTE — Assessment & Plan Note (Addendum)
Labs are reviewed and discussed with patient. Heterozygous hemochomotosis  C282Y Recommend patient to avoid alcohol, iron supplementation, vitamin c supplementation.   Lab Results  Component Value Date   HGB 18.1 (H) 10/19/2023   TIBC 389 10/19/2023   IRONPCTSAT 33 10/19/2023   FERRITIN 265 10/19/2023     Ferritin is <500, normal iron saturation elevation, no phlebotomy needed Recommend continue observation  US RUQ for evaluation of liver-patient prefers to defer at this point due to cost concern.

## 2023-10-22 NOTE — Assessment & Plan Note (Signed)
This is new. Possibly due to [place].  Recommend patient to discuss with PCP to if Jardiance can be hold and we will repeat cbc.

## 2023-11-12 ENCOUNTER — Inpatient Hospital Stay: Payer: PPO | Attending: Oncology

## 2023-11-12 DIAGNOSIS — Z148 Genetic carrier of other disease: Secondary | ICD-10-CM | POA: Diagnosis not present

## 2023-11-12 DIAGNOSIS — D751 Secondary polycythemia: Secondary | ICD-10-CM | POA: Diagnosis not present

## 2023-11-12 LAB — CBC WITH DIFFERENTIAL (CANCER CENTER ONLY)
Abs Immature Granulocytes: 0.03 10*3/uL (ref 0.00–0.07)
Basophils Absolute: 0 10*3/uL (ref 0.0–0.1)
Basophils Relative: 1 %
Eosinophils Absolute: 0.1 10*3/uL (ref 0.0–0.5)
Eosinophils Relative: 1 %
HCT: 47.1 % (ref 39.0–52.0)
Hemoglobin: 17.2 g/dL — ABNORMAL HIGH (ref 13.0–17.0)
Immature Granulocytes: 0 %
Lymphocytes Relative: 19 %
Lymphs Abs: 1.3 10*3/uL (ref 0.7–4.0)
MCH: 32.1 pg (ref 26.0–34.0)
MCHC: 36.5 g/dL — ABNORMAL HIGH (ref 30.0–36.0)
MCV: 88 fL (ref 80.0–100.0)
Monocytes Absolute: 0.6 10*3/uL (ref 0.1–1.0)
Monocytes Relative: 9 %
Neutro Abs: 4.8 10*3/uL (ref 1.7–7.7)
Neutrophils Relative %: 70 %
Platelet Count: 125 10*3/uL — ABNORMAL LOW (ref 150–400)
RBC: 5.35 MIL/uL (ref 4.22–5.81)
RDW: 12.4 % (ref 11.5–15.5)
WBC Count: 6.9 10*3/uL (ref 4.0–10.5)
nRBC: 0 % (ref 0.0–0.2)

## 2023-11-13 ENCOUNTER — Telehealth: Payer: Self-pay | Admitting: Family Medicine

## 2023-11-13 NOTE — Telephone Encounter (Signed)
Called patient reviewed all information with him. He would like to stay on jardiance. He has been off for 3 weeks but would like to start back on it in the morning.

## 2023-11-13 NOTE — Telephone Encounter (Signed)
Ok will continue jardiance.

## 2023-11-13 NOTE — Telephone Encounter (Signed)
Received message from heme/onc about jardiance possibly causing concentrated blood  (Hgb up to 18).   He has metformin intolerance and has declined injectable medications (and with BMI 25 wouldn't necessarily recommend GLP1RA).  [place] Pepper is preferred in h/o CAD. However if need to come off this, we could try glipizide 5mg  daily with breakfast - needs to take with food to avoid low sugars.   Plz notify pt of above - does he want to try different sugar medicine?
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
## 2023-11-16 ENCOUNTER — Encounter: Payer: Self-pay | Admitting: Cardiovascular Disease

## 2023-11-16 ENCOUNTER — Ambulatory Visit: Payer: PPO | Attending: Cardiovascular Disease | Admitting: Cardiovascular Disease

## 2023-11-16 VITALS — BP 120/70 | HR 65 | Ht 68.0 in | Wt 170.4 lb

## 2023-11-16 DIAGNOSIS — E1169 Type 2 diabetes mellitus with other specified complication: Secondary | ICD-10-CM

## 2023-11-16 DIAGNOSIS — E785 Hyperlipidemia, unspecified: Secondary | ICD-10-CM

## 2023-11-16 DIAGNOSIS — I25118 Atherosclerotic heart disease of native coronary artery with other forms of angina pectoris: Secondary | ICD-10-CM | POA: Diagnosis not present

## 2023-11-16 DIAGNOSIS — I10 Essential (primary) hypertension: Secondary | ICD-10-CM | POA: Diagnosis not present

## 2023-11-16 NOTE — Patient Instructions (Signed)

## 2024-02-09 ENCOUNTER — Ambulatory Visit: Payer: HMO | Admitting: Family Medicine

## 2024-02-17 ENCOUNTER — Ambulatory Visit (INDEPENDENT_AMBULATORY_CARE_PROVIDER_SITE_OTHER): Admitting: Family Medicine

## 2024-02-17 ENCOUNTER — Encounter: Payer: Self-pay | Admitting: Family Medicine

## 2024-02-17 VITALS — BP 136/74 | HR 64 | Temp 98.0°F | Ht 68.0 in | Wt 167.5 lb

## 2024-02-17 DIAGNOSIS — E1129 Type 2 diabetes mellitus with other diabetic kidney complication: Secondary | ICD-10-CM

## 2024-02-17 DIAGNOSIS — E1169 Type 2 diabetes mellitus with other specified complication: Secondary | ICD-10-CM | POA: Diagnosis not present

## 2024-02-17 DIAGNOSIS — Z7984 Long term (current) use of oral hypoglycemic drugs: Secondary | ICD-10-CM

## 2024-02-17 DIAGNOSIS — Z148 Genetic carrier of other disease: Secondary | ICD-10-CM | POA: Diagnosis not present

## 2024-02-17 DIAGNOSIS — R809 Proteinuria, unspecified: Secondary | ICD-10-CM

## 2024-02-17 LAB — POCT GLYCOSYLATED HEMOGLOBIN (HGB A1C): Hemoglobin A1C: 6.3 % — AB (ref 4.0–5.6)

## 2024-02-17 MED ORDER — ONETOUCH DELICA PLUS LANCET33G MISC: 3 refills | Status: AC

## 2024-02-17 MED ORDER — ONETOUCH ULTRA VI STRP: ORAL_STRIP | 3 refills | Status: AC

## 2024-02-17 NOTE — Assessment & Plan Note (Signed)
Appreciate heme care.

## 2024-02-17 NOTE — Progress Notes (Signed)
 Ph: [phone] Fax: [phone]   Patient ID: Joseph Shea, male    DOB: [birth year], 73 y.o.   MRN: [id number]  This visit was conducted in person.  BP 136/74   Pulse 64   Temp 98 F (36.7 C) (Oral)   Ht 5\' 8"  (1.727 m)   Wt 167 lb 8 oz (76 kg)   SpO2 99%   BMI 25.47 kg/m    CC: DM f/u visit  Subjective:   HPI: Joseph Shea is a 73 y.o. male presenting on 02/17/2024 for Medical Management of Chronic Issues (Here for 4 mo DM f/u.)   Known hemochromatosis carrier (C282Y heterozygote) - sees heme Dr Wilhelmenia Harada. Goal ferritin <500. New erythrocytosis ?jardiance  related.   Discussed recently discovered [facility] laboratory miscalculation - the UACR calculation in the software of the system was incorrect but the absolute levels of microalbumin and creatinine were correct.   DM - does regularly check sugars - fasting this morning 191, some mornings 130s. Compliant with antihyperglycemic regimen which includes: jardiance  10mg  daily. No UTI symptoms, yeast infection symptoms. Denies low sugars or hypoglycemic symptoms. Denies paresthesias, blurry vision. Last diabetic eye exam 05/2023. Glucometer brand: one touch ultra. Last foot exam: 09/2023. DSME: declined.  Lab Results  Component Value Date   HGBA1C 6.3 (A) 02/17/2024   Diabetic Foot Exam - Simple   No data filed    Lab Results  Component Value Date   MICROALBUR 7.9 (H) 07/07/2023       Relevant past medical, surgical, family and social history reviewed and updated as indicated. Interim medical history since our last visit reviewed. Allergies and medications reviewed and updated. Outpatient Medications Prior to Visit  Medication Sig Dispense Refill   aspirin 81 MG tablet Take 1 tablet (81 mg total) by mouth daily. 30 tablet    Blood Glucose Monitoring Suppl (ONE TOUCH ULTRA 2) w/Device KIT Use as instructed to check blood sugar once a day 1 kit 0   empagliflozin  (JARDIANCE ) 10 MG TABS tablet Take 1 tablet (10 mg  total) by mouth daily before breakfast. 30 tablet 11   lisinopril  (ZESTRIL ) 40 MG tablet Take 1 tablet (40 mg total) by mouth daily. 90 tablet 4   metoprolol  tartrate (LOPRESSOR ) 25 MG tablet Take 1 tablet (25 mg total) by mouth 2 (two) times daily. 180 tablet 4   nitroGLYCERIN  (NITROSTAT ) 0.4 MG SL tablet Place 1 tablet (0.4 mg total) under the tongue every 5 (five) minutes as needed. Reported on 10/16/2015 30 tablet 1   rosuvastatin  (CRESTOR ) 20 MG tablet Take 1 tablet (20 mg total) by mouth daily. 90 tablet 4   glucose blood (ONETOUCH ULTRA) test strip Use as instructed to check blood sugar once a day 100 each 3   Lancets (ONETOUCH DELICA PLUS LANCET33G) MISC Use as instructed to check blood sugar once a day 100 each 3   No facility-administered medications prior to visit.     Per HPI unless specifically indicated in ROS section below Review of Systems  Objective:  BP 136/74   Pulse 64   Temp 98 F (36.7 C) (Oral)   Ht 5\' 8"  (1.727 m)   Wt 167 lb 8 oz (76 kg)   SpO2 99%   BMI 25.47 kg/m   Wt Readings from Last 3 Encounters:  02/17/24 167 lb 8 oz (76 kg)  11/16/23 170 lb 6 oz (77.3 kg)  10/22/23 168 lb (76.2 kg)      Physical Exam Vitals and nursing note  reviewed.  Constitutional:      Appearance: Normal appearance. He is not ill-appearing.  HENT:     Head: Normocephalic and atraumatic.     Mouth/Throat:     Mouth: Mucous membranes are moist.     Pharynx: Oropharynx is clear. No oropharyngeal exudate or posterior oropharyngeal erythema.  Eyes:     Extraocular Movements: Extraocular movements intact.     Conjunctiva/sclera: Conjunctivae normal.     Pupils: Pupils are equal, round, and reactive to light.  Cardiovascular:     Rate and Rhythm: Normal rate and regular rhythm.     Pulses: Normal pulses.     Heart sounds: Normal heart sounds. No murmur heard. Pulmonary:     Effort: Pulmonary effort is normal. No respiratory distress.     Breath sounds: Normal breath sounds.  No wheezing, rhonchi or rales.  Musculoskeletal:     Right lower leg: No edema.     Left lower leg: No edema.     Comments: See HPI for foot exam if done  Skin:    General: Skin is warm and dry.     Findings: No rash.  Neurological:     Mental Status: He is alert.  Psychiatric:        Mood and Affect: Mood normal.        Behavior: Behavior normal.       Results for orders placed or performed in visit on 02/17/24  POCT glycosylated hemoglobin (Hb A1C)   Collection Time: 02/17/24  8:05 AM  Result Value Ref Range   Hemoglobin A1C 6.3 (A) 4.0 - 5.6 %   HbA1c POC (<> result, manual entry)     HbA1c, POC (prediabetic range)     HbA1c, POC (controlled diabetic range)      Assessment & Plan:   Problem List Items Addressed This Visit     Hemochromatosis carrier (Chronic)   Appreciate heme care.       Type 2 diabetes mellitus with diabetic microalbuminuria, without long-term current use of insulin (HCC) - Primary   Chronic, stable on current regimen - continue jardiance .  Metformin  intolerance (GI upset).  Reviewed recent microalbuminuria noted on labs 06/2023 - he was unable to provide urine sample to repeat test - will recheck at CPE. Already on ACEI and SGLT2i      Relevant Medications   glucose blood (ONETOUCH ULTRA) test strip   Lancets (ONETOUCH DELICA PLUS LANCET33G) MISC     Meds ordered this encounter  Medications   glucose blood (ONETOUCH ULTRA) test strip    Sig: Use as instructed to check blood sugar once a day    Dispense:  100 each    Refill:  3   Lancets (ONETOUCH DELICA PLUS LANCET33G) MISC    Sig: Use as instructed to check blood sugar once a day    Dispense:  100 each    Refill:  3    Orders Placed This Encounter  Procedures   POCT glycosylated hemoglobin (Hb A1C)    Patient Instructions  Good to see you today.  Return as needed or in 5 months for physical.   Follow up plan: Return in about 5 months (around 07/19/2024) for annual exam, prior  fasting for blood work, medicare wellness visit.  Claire Crick, MD

## 2024-02-17 NOTE — Assessment & Plan Note (Deleted)
Appreciate heme care.

## 2024-02-17 NOTE — Assessment & Plan Note (Addendum)
 Chronic, stable on current regimen - continue jardiance .  Metformin  intolerance (GI upset).  Reviewed recent microalbuminuria noted on labs 06/2023 - he was unable to provide urine sample to repeat test - will recheck at CPE. Already on ACEI and SGLT2i

## 2024-02-17 NOTE — Patient Instructions (Addendum)
 Good to see you today.  Return as needed or in 5 months for physical.

## 2024-02-26 ENCOUNTER — Encounter: Payer: Self-pay | Admitting: Pharmacist

## 2024-02-26 NOTE — Progress Notes (Signed)
 Pharmacy Quality Measure Review  This patient is appearing on a report for being at risk of failing the adherence measure for diabetes medications this calendar year.   Medication: JARDIANCE  10 MG Last fill date: 01/20/24 for 30 day supply  Insurance report was not up to date. No action needed at this time.  Refill history skewed by: Patient help Jardiance  x~3 weeks in March to assess for possible side effects.  Medication has been refilled as of 02/17/2024 for 30 day supply.  5 additional refills remaining.

## 2024-04-08 ENCOUNTER — Encounter: Payer: Self-pay | Admitting: Advanced Practice Midwife

## 2024-04-21 ENCOUNTER — Inpatient Hospital Stay: Payer: HMO | Attending: Oncology

## 2024-04-21 DIAGNOSIS — D751 Secondary polycythemia: Secondary | ICD-10-CM | POA: Insufficient documentation

## 2024-04-21 DIAGNOSIS — Z148 Genetic carrier of other disease: Secondary | ICD-10-CM | POA: Diagnosis not present

## 2024-04-21 DIAGNOSIS — Z832 Family history of diseases of the blood and blood-forming organs and certain disorders involving the immune mechanism: Secondary | ICD-10-CM | POA: Diagnosis not present

## 2024-04-21 DIAGNOSIS — Z79899 Other long term (current) drug therapy: Secondary | ICD-10-CM | POA: Insufficient documentation

## 2024-04-21 LAB — CBC WITH DIFFERENTIAL (CANCER CENTER ONLY)
Abs Immature Granulocytes: 0.02 K/uL (ref 0.00–0.07)
Basophils Absolute: 0 K/uL (ref 0.0–0.1)
Basophils Relative: 0 %
Eosinophils Absolute: 0 K/uL (ref 0.0–0.5)
Eosinophils Relative: 1 %
HCT: 47.8 % (ref 39.0–52.0)
Hemoglobin: 17.4 g/dL — ABNORMAL HIGH (ref 13.0–17.0)
Immature Granulocytes: 0 %
Lymphocytes Relative: 19 %
Lymphs Abs: 1.3 K/uL (ref 0.7–4.0)
MCH: 32.3 pg (ref 26.0–34.0)
MCHC: 36.4 g/dL — ABNORMAL HIGH (ref 30.0–36.0)
MCV: 88.8 fL (ref 80.0–100.0)
Monocytes Absolute: 0.6 K/uL (ref 0.1–1.0)
Monocytes Relative: 8 %
Neutro Abs: 4.8 K/uL (ref 1.7–7.7)
Neutrophils Relative %: 72 %
Platelet Count: 115 K/uL — ABNORMAL LOW (ref 150–400)
RBC: 5.38 MIL/uL (ref 4.22–5.81)
RDW: 13 % (ref 11.5–15.5)
WBC Count: 6.8 K/uL (ref 4.0–10.5)
nRBC: 0 % (ref 0.0–0.2)

## 2024-04-21 LAB — HEPATIC FUNCTION PANEL
ALT: 22 U/L (ref 0–44)
AST: 18 U/L (ref 15–41)
Albumin: 4.8 g/dL (ref 3.5–5.0)
Alkaline Phosphatase: 66 U/L (ref 38–126)
Bilirubin, Direct: 0.2 mg/dL (ref 0.0–0.2)
Indirect Bilirubin: 1.4 mg/dL — ABNORMAL HIGH (ref 0.3–0.9)
Total Bilirubin: 1.6 mg/dL — ABNORMAL HIGH (ref 0.0–1.2)
Total Protein: 7.3 g/dL (ref 6.5–8.1)

## 2024-04-21 LAB — IRON AND TIBC
Iron: 132 ug/dL (ref 45–182)
Saturation Ratios: 36 % (ref 17.9–39.5)
TIBC: 370 ug/dL (ref 250–450)
UIBC: 238 ug/dL

## 2024-04-21 LAB — FERRITIN: Ferritin: 155 ng/mL (ref 24–336)

## 2024-04-28 ENCOUNTER — Inpatient Hospital Stay: Payer: HMO | Attending: Oncology | Admitting: Oncology

## 2024-04-28 ENCOUNTER — Encounter: Payer: Self-pay | Admitting: Oncology

## 2024-04-28 VITALS — BP 126/77 | HR 64 | Temp 97.2°F | Resp 18 | Wt 173.3 lb

## 2024-04-28 DIAGNOSIS — Z8249 Family history of ischemic heart disease and other diseases of the circulatory system: Secondary | ICD-10-CM | POA: Diagnosis not present

## 2024-04-28 DIAGNOSIS — E785 Hyperlipidemia, unspecified: Secondary | ICD-10-CM | POA: Diagnosis not present

## 2024-04-28 DIAGNOSIS — Z7982 Long term (current) use of aspirin: Secondary | ICD-10-CM | POA: Diagnosis not present

## 2024-04-28 DIAGNOSIS — Z79899 Other long term (current) drug therapy: Secondary | ICD-10-CM | POA: Insufficient documentation

## 2024-04-28 DIAGNOSIS — Z87891 Personal history of nicotine dependence: Secondary | ICD-10-CM | POA: Diagnosis not present

## 2024-04-28 DIAGNOSIS — I2510 Atherosclerotic heart disease of native coronary artery without angina pectoris: Secondary | ICD-10-CM | POA: Insufficient documentation

## 2024-04-28 DIAGNOSIS — D751 Secondary polycythemia: Secondary | ICD-10-CM | POA: Insufficient documentation

## 2024-04-28 DIAGNOSIS — Z8041 Family history of malignant neoplasm of ovary: Secondary | ICD-10-CM | POA: Insufficient documentation

## 2024-04-28 DIAGNOSIS — Z823 Family history of stroke: Secondary | ICD-10-CM | POA: Diagnosis not present

## 2024-04-28 DIAGNOSIS — I10 Essential (primary) hypertension: Secondary | ICD-10-CM | POA: Insufficient documentation

## 2024-04-28 DIAGNOSIS — Z832 Family history of diseases of the blood and blood-forming organs and certain disorders involving the immune mechanism: Secondary | ICD-10-CM | POA: Insufficient documentation

## 2024-04-28 DIAGNOSIS — Z148 Genetic carrier of other disease: Secondary | ICD-10-CM | POA: Diagnosis not present

## 2024-04-28 NOTE — Assessment & Plan Note (Addendum)
 Labs are reviewed and discussed with patient. Heterozygous hemochomotosis  C282Y Recommend patient to avoid alcohol, iron supplementation, vitamin c supplementation.   Lab Results  Component Value Date   HGB 17.4 (H) 04/21/2024   TIBC 370 04/21/2024   IRONPCTSAT 36 04/21/2024   FERRITIN 155 04/21/2024     Ferritin is <500, normal iron saturation elevation, no phlebotomy needed Recommend continue observation  US  RUQ for evaluation
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
## 2024-04-28 NOTE — Assessment & Plan Note (Addendum)
 Possibly due to Jardiance .  Hemoglobin is improved, mildly elevated.  Check erythrocytosis workup at next visit.

## 2024-05-05 ENCOUNTER — Ambulatory Visit
Admission: RE | Admit: 2024-05-05 | Discharge: 2024-05-05 | Disposition: A | Discharge status: Home / Self Care | Source: Ambulatory Visit | Attending: Oncology | Admitting: Oncology

## 2024-05-05 DIAGNOSIS — K7689 Other specified diseases of liver: Secondary | ICD-10-CM | POA: Diagnosis not present

## 2024-05-05 DIAGNOSIS — Z148 Genetic carrier of other disease: Secondary | ICD-10-CM | POA: Diagnosis not present

## 2024-05-05 DIAGNOSIS — K8020 Calculus of gallbladder without cholecystitis without obstruction: Secondary | ICD-10-CM | POA: Diagnosis not present

## 2024-05-18 ENCOUNTER — Ambulatory Visit: Payer: Self-pay | Admitting: Oncology

## 2024-06-24 ENCOUNTER — Ambulatory Visit

## 2024-06-24 ENCOUNTER — Encounter

## 2024-06-24 VITALS — Ht 68.0 in | Wt 173.0 lb

## 2024-06-24 DIAGNOSIS — Z0000 Encounter for general adult medical examination without abnormal findings: Secondary | ICD-10-CM | POA: Diagnosis not present
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
## 2024-06-24 NOTE — Patient Instructions (Signed)
 [name],  Thank you for taking the time for your Medicare Wellness Visit. I appreciate your continued commitment to your health goals. Please review the care plan we discussed, and feel free to reach out if I can assist you further.  Medicare recommends these wellness visits once per year to help you and your care team stay ahead of potential health issues. These visits are designed to focus on prevention, allowing your provider to concentrate on managing your acute and chronic conditions during your regular appointments.  Please note that Annual Wellness Visits do not include a physical exam. Some assessments may be limited, especially if the visit was conducted virtually. If needed, we may recommend a separate in-person follow-up with your provider.  Ongoing Care Seeing your primary care provider every 3 to 6 months helps us  monitor your health and provide consistent, personalized care. Next office visit on 07/19/2024.  You are due for a Flu vaccine and a kidney evaluation, (urine sample), which you will get that done at your up coming office visit.  You are also due for a diabetic eye exam.  Please call [facility] to get scheduled and have them send results over to your provider.  Keep up the good work.  Referrals If a referral was made during today's visit and you haven't received any updates within two weeks, please contact the referred provider directly to check on the status.  Recommended Screenings:  Health Maintenance  Topic Date Due   Yearly kidney health urinalysis for diabetes  Never done   Zoster (Shingles) Vaccine (1 of 2) Never done   Medicare Annual Wellness Visit  03/04/2024   Flu Shot  04/22/2024   COVID-19 Vaccine (3 - 2025-26 season) 05/23/2024   Eye exam for diabetics  05/31/2024   Stool Blood Test  07/06/2024   Hemoglobin A1C  08/19/2024   Complete foot exam   10/11/2024   Yearly kidney function blood test for diabetes  10/18/2024   DTaP/Tdap/Td vaccine (3 - Td  or Tdap) 10/15/2025   Cologuard (Stool DNA test)  07/05/2026   Pneumococcal Vaccine for age over 79  Completed   Hepatitis C Screening  Completed   HPV Vaccine  Aged Out   Meningitis B Vaccine  Aged Out       06/24/2024   11:32 AM  Advanced Directives  Does Patient Have a Medical Advance Directive? No   Advance Care Planning is important because it: Ensures you receive medical care that aligns with your values, goals, and preferences. Provides guidance to your family and loved ones, reducing the emotional burden of decision-making during critical moments.  Vision: Annual vision screenings are recommended for early detection of glaucoma, cataracts, and diabetic retinopathy. These exams can also reveal signs of chronic conditions such as diabetes and high blood pressure.  Dental: Annual dental screenings help detect early signs of oral cancer, gum disease, and other conditions linked to overall health, including heart disease and diabetes.  Please see the attached documents for additional preventive care recommendations.

## 2024-07-12 ENCOUNTER — Other Ambulatory Visit: Payer: Self-pay | Admitting: Family Medicine

## 2024-07-12 DIAGNOSIS — E782 Mixed hyperlipidemia: Secondary | ICD-10-CM

## 2024-07-12 DIAGNOSIS — E1129 Type 2 diabetes mellitus with other diabetic kidney complication: Secondary | ICD-10-CM

## 2024-07-12 DIAGNOSIS — D696 Thrombocytopenia, unspecified: Secondary | ICD-10-CM

## 2024-07-12 DIAGNOSIS — E041 Nontoxic single thyroid nodule: Secondary | ICD-10-CM

## 2024-07-12 DIAGNOSIS — Z125 Encounter for screening for malignant neoplasm of prostate: Secondary | ICD-10-CM

## 2024-07-13 ENCOUNTER — Other Ambulatory Visit

## 2024-07-15 ENCOUNTER — Ambulatory Visit: Payer: Self-pay | Admitting: Family Medicine

## 2024-07-15 ENCOUNTER — Other Ambulatory Visit

## 2024-07-15 DIAGNOSIS — Z125 Encounter for screening for malignant neoplasm of prostate: Secondary | ICD-10-CM

## 2024-07-15 DIAGNOSIS — D696 Thrombocytopenia, unspecified: Secondary | ICD-10-CM | POA: Diagnosis not present

## 2024-07-15 DIAGNOSIS — R809 Proteinuria, unspecified: Secondary | ICD-10-CM

## 2024-07-15 DIAGNOSIS — E1129 Type 2 diabetes mellitus with other diabetic kidney complication: Secondary | ICD-10-CM

## 2024-07-15 DIAGNOSIS — E041 Nontoxic single thyroid nodule: Secondary | ICD-10-CM | POA: Diagnosis not present

## 2024-07-15 DIAGNOSIS — E782 Mixed hyperlipidemia: Secondary | ICD-10-CM | POA: Diagnosis not present

## 2024-07-15 LAB — HEMOGLOBIN A1C: Hgb A1c MFr Bld: 6.9 % — ABNORMAL HIGH (ref 4.6–6.5)

## 2024-07-15 LAB — CBC WITH DIFFERENTIAL/PLATELET
Basophils Absolute: 0 K/uL (ref 0.0–0.1)
Basophils Relative: 0.3 % (ref 0.0–3.0)
Eosinophils Absolute: 0 K/uL (ref 0.0–0.7)
Eosinophils Relative: 0.5 % (ref 0.0–5.0)
HCT: 49.1 % (ref 39.0–52.0)
Hemoglobin: 16.8 g/dL (ref 13.0–17.0)
Lymphocytes Relative: 16.2 % (ref 12.0–46.0)
Lymphs Abs: 0.9 K/uL (ref 0.7–4.0)
MCHC: 34.2 g/dL (ref 30.0–36.0)
MCV: 93 fl (ref 78.0–100.0)
Monocytes Absolute: 0.5 K/uL (ref 0.1–1.0)
Monocytes Relative: 8.7 % (ref 3.0–12.0)
Neutro Abs: 4.2 K/uL (ref 1.4–7.7)
Neutrophils Relative %: 74.3 % (ref 43.0–77.0)
Platelets: 99 K/uL — ABNORMAL LOW (ref 150.0–400.0)
RBC: 5.28 Mil/uL (ref 4.22–5.81)
RDW: 13.2 % (ref 11.5–15.5)
WBC: 5.7 K/uL (ref 4.0–10.5)

## 2024-07-15 LAB — COMPREHENSIVE METABOLIC PANEL WITH GFR
ALT: 25 U/L (ref 0–53)
AST: 16 U/L (ref 0–37)
Albumin: 4.8 g/dL (ref 3.5–5.2)
Alkaline Phosphatase: 62 U/L (ref 39–117)
BUN: 15 mg/dL (ref 6–23)
CO2: 27 meq/L (ref 19–32)
Calcium: 9.2 mg/dL (ref 8.4–10.5)
Chloride: 103 meq/L (ref 96–112)
Creatinine, Ser: 0.96 mg/dL (ref 0.40–1.50)
GFR: 79.57 mL/min (ref >60.00)
Glucose, Bld: 173 mg/dL — ABNORMAL HIGH (ref 70–99)
Potassium: 4.7 meq/L (ref 3.5–5.1)
Sodium: 138 meq/L (ref 135–145)
Total Bilirubin: 1.5 mg/dL — ABNORMAL HIGH (ref 0.2–1.2)
Total Protein: 6.6 g/dL (ref 6.0–8.3)

## 2024-07-15 LAB — LIPID PANEL
Cholesterol: 140 mg/dL (ref 0–200)
HDL: 53 mg/dL (ref >39.00)
LDL Cholesterol: 68 mg/dL (ref 0–99)
NonHDL: 86.6
Total CHOL/HDL Ratio: 3
Triglycerides: 94 mg/dL (ref 0.0–149.0)
VLDL: 18.8 mg/dL (ref 0.0–40.0)

## 2024-07-15 LAB — PSA: PSA: 0.42 ng/mL (ref 0.10–4.00)

## 2024-07-15 LAB — TSH: TSH: 1.28 u[IU]/mL (ref 0.35–5.50)

## 2024-07-15 LAB — MICROALBUMIN / CREATININE URINE RATIO
Creatinine,U: 48.4 mg/dL
Microalb Creat Ratio: UNDETERMINED mg/g (ref 0.0–30.0)
Microalb, Ur: <0.7 mg/dL

## 2024-07-15 LAB — VITAMIN B12: Vitamin B-12: 627 pg/mL (ref 211–911)

## 2024-07-19 ENCOUNTER — Ambulatory Visit: Admitting: Family Medicine

## 2024-07-19 ENCOUNTER — Encounter: Payer: Self-pay | Admitting: Family Medicine

## 2024-07-19 VITALS — BP 118/80 | HR 60 | Temp 98.9°F | Ht 68.0 in | Wt 167.0 lb

## 2024-07-19 DIAGNOSIS — R351 Nocturia: Secondary | ICD-10-CM | POA: Diagnosis not present

## 2024-07-19 DIAGNOSIS — Z148 Genetic carrier of other disease: Secondary | ICD-10-CM | POA: Diagnosis not present

## 2024-07-19 DIAGNOSIS — I10 Essential (primary) hypertension: Secondary | ICD-10-CM | POA: Diagnosis not present

## 2024-07-19 DIAGNOSIS — Z0000 Encounter for general adult medical examination without abnormal findings: Secondary | ICD-10-CM

## 2024-07-19 DIAGNOSIS — E1129 Type 2 diabetes mellitus with other diabetic kidney complication: Secondary | ICD-10-CM

## 2024-07-19 DIAGNOSIS — I25118 Atherosclerotic heart disease of native coronary artery with other forms of angina pectoris: Secondary | ICD-10-CM

## 2024-07-19 DIAGNOSIS — E785 Hyperlipidemia, unspecified: Secondary | ICD-10-CM

## 2024-07-19 DIAGNOSIS — R809 Proteinuria, unspecified: Secondary | ICD-10-CM

## 2024-07-19 DIAGNOSIS — E1169 Type 2 diabetes mellitus with other specified complication: Secondary | ICD-10-CM | POA: Diagnosis not present

## 2024-07-19 DIAGNOSIS — D751 Secondary polycythemia: Secondary | ICD-10-CM | POA: Diagnosis not present

## 2024-07-19 DIAGNOSIS — D696 Thrombocytopenia, unspecified: Secondary | ICD-10-CM

## 2024-07-19 DIAGNOSIS — N401 Enlarged prostate with lower urinary tract symptoms: Secondary | ICD-10-CM

## 2024-07-19 DIAGNOSIS — Z7189 Other specified counseling: Secondary | ICD-10-CM

## 2024-07-19 MED ORDER — ROSUVASTATIN CALCIUM 20 MG PO TABS: 20.0000 mg | ORAL_TABLET | Freq: Every day | ORAL | 3 refills | Status: AC

## 2024-07-19 MED ORDER — METOPROLOL TARTRATE 25 MG PO TABS
25.0000 mg | ORAL_TABLET | Freq: Two times a day (BID) | ORAL | 3 refills | Status: AC
Start: 2024-07-19 — End: ?

## 2024-07-19 MED ORDER — LISINOPRIL 40 MG PO TABS: 40.0000 mg | ORAL_TABLET | Freq: Every day | ORAL | 3 refills | Status: AC

## 2024-07-19 MED ORDER — EMPAGLIFLOZIN 10 MG PO TABS: 10.0000 mg | ORAL_TABLET | Freq: Every day | ORAL | 11 refills | Status: AC

## 2024-07-19 NOTE — Assessment & Plan Note (Signed)
Sees hematology

## 2024-07-19 NOTE — Assessment & Plan Note (Signed)
 Previously discussed-  continues working on this.

## 2024-07-19 NOTE — Assessment & Plan Note (Signed)
 Chronic, stable on current regimen - continue.

## 2024-07-19 NOTE — Patient Instructions (Addendum)
 Sugar was borderline this time with A1c 6.9% - work on low sugar low carb diabetic diet. We will refer you to diabetes education classes at [facility]. Good to see you today Return as needed or in 6 months for diabetes follow up visit

## 2024-07-19 NOTE — Assessment & Plan Note (Signed)
 Chronic, well controlled on rosuvastatin  20mg  daily - continue. The 10-year ASCVD risk score (Arnett DK, et al., 2019) is: 29%   Values used to calculate the score:     Age: 73 years     Clincally relevant sex: Male     Is Non-Hispanic African American: No     Diabetic: Yes     Tobacco smoker: No     Systolic Blood Pressure: 118 mmHg     Is BP treated: Yes     HDL Cholesterol: 53 mg/dL     Total Cholesterol: 140 mg/dL

## 2024-07-19 NOTE — Assessment & Plan Note (Signed)
 Preventative protocols reviewed and updated unless pt declined. Discussed healthy diet and lifestyle.

## 2024-07-19 NOTE — Assessment & Plan Note (Signed)
Sees cards yearly.

## 2024-07-19 NOTE — Assessment & Plan Note (Signed)
 Actually improved on latest check.

## 2024-07-19 NOTE — Progress Notes (Signed)
 Ph: [phone] Fax: [phone]   Patient ID: Joseph Shea, male    DOB: 03-29-1951, 73 y.o.   MRN: [id number]  This visit was conducted in person.  BP 118/80   Pulse 60   Temp 98.9 F (37.2 C) (Oral)   Ht 5' 8 (1.727 m)   Wt 167 lb (75.8 kg)   SpO2 99%   BMI 25.39 kg/m   BP Readings from Last 3 Encounters:  07/19/24 118/80  04/28/24 126/77  02/17/24 136/74   CC: CPE Subjective:   HPI: Joseph Shea is a 73 y.o. male presenting on 07/19/2024 for Annual Exam    Saw health advisor earlier in the month for medicare wellness visit. Note reviewed.   No results found.  Flowsheet Row Office Visit from 07/19/2024 in [facility] [place]  PHQ-2 Total Score 0       07/19/2024   10:27 AM 06/24/2024   11:32 AM 02/17/2024    8:05 AM 10/12/2023    9:03 AM 03/05/2023   12:59 PM  Fall Risk   Falls in the past year? 0 0 0 0 0  Number falls in past yr: 0 0   0  Injury with Fall? 0 0   0  Risk for fall due to : No Fall Risks    No Fall Risks  Follow up Falls evaluation completed Falls evaluation completed;Falls prevention discussed   Falls prevention discussed;Falls evaluation completed   Recent dx hemochromatosis carrier (HFE C282Y heterozygote), followed by hematology Dr Babara - rec avoid alcohol, oral iron, vit C supplement, keep ferritin <500. Rec observation. RUQ US  - cholelithiasis with increased echogenicity possibly due to hepatic steatosis. Hyperbili thought Gilbert syndrome.   DM - on jardiance  10mg  daily. Has declined DSME.  1 month ago developed bilat foot pain after long period working on concrete in his garage for a long time, working on his bike, currently still with L foot discomfort.   Preventative: Colon cancer screening - Cologuard normal 06/2023  Prostate cancer screening - nocturia x2-3, strong stream. Continue yearly PSA.  Lung cancer screening - not eligible  Flu shot yearly  Tdap 2017  COVID vaccine - Pfizer x2 10/2019   Pneumovax-23 - 04/2019, Prevnar-20 04/2021 Shingrix - discussed, to consider  Advanced directives - working on this. Wife is HCPOA. Would consider CPR but no prolonged life support if terminal condition. Asked to bring us  a copy when complete. He is organ donor.  Seat belt use discussed Sunscreen use discussed. No changing moles on skin.  Non smoker - wife smokes outside  Alcohol - none  Dentist - yearly  Eye exam - yearly ([facility]) Bowel - no constipation  Bladder - no incontinence   Lives with wife Occupation: retired dentist - works at [employer]  Activity: rides bike 2-3 x/wk a few miles, walks some  Diet: fruits/vegetables daily, small amt water      Relevant past medical, surgical, family and social history reviewed and updated as indicated. Interim medical history since our last visit reviewed. Allergies and medications reviewed and updated. Outpatient Medications Prior to Visit  Medication Sig Dispense Refill   aspirin 81 MG tablet Take 1 tablet (81 mg total) by mouth daily. 30 tablet    Blood Glucose Monitoring Suppl (ONE TOUCH ULTRA 2) w/Device KIT Use as instructed to check blood sugar once a day 1 kit 0   glucose blood (ONETOUCH ULTRA) test strip Use  as instructed to check blood sugar once a day 100 each 3   Lancets (ONETOUCH DELICA PLUS LANCET33G) MISC Use as instructed to check blood sugar once a day 100 each 3   nitroGLYCERIN  (NITROSTAT ) 0.4 MG SL tablet Place 1 tablet (0.4 mg total) under the tongue every 5 (five) minutes as needed. Reported on 10/16/2015 30 tablet 1   empagliflozin  (JARDIANCE ) 10 MG TABS tablet Take 1 tablet (10 mg total) by mouth daily before breakfast. 30 tablet 11   lisinopril  (ZESTRIL ) 40 MG tablet Take 1 tablet (40 mg total) by mouth daily. 90 tablet 4   metoprolol  tartrate (LOPRESSOR ) 25 MG tablet Take 1 tablet (25 mg total) by mouth 2 (two) times daily. 180 tablet 4   rosuvastatin  (CRESTOR ) 20  MG tablet Take 1 tablet (20 mg total) by mouth daily. 90 tablet 4   No facility-administered medications prior to visit.     Per HPI unless specifically indicated in ROS section below Review of Systems  Constitutional:  Negative for activity change, appetite change, chills, fatigue, fever and unexpected weight change.  HENT:  Negative for hearing loss.   Eyes:  Negative for visual disturbance.  Respiratory:  Negative for cough, chest tightness, shortness of breath and wheezing.   Cardiovascular:  Negative for chest pain, palpitations and leg swelling.  Gastrointestinal:  Negative for abdominal distention, abdominal pain, blood in stool, constipation, diarrhea, nausea and vomiting.  Genitourinary:  Negative for difficulty urinating and hematuria.  Musculoskeletal:  Negative for arthralgias, myalgias and neck pain.  Skin:  Negative for rash.  Neurological:  Negative for dizziness, seizures, syncope and headaches.  Hematological:  Negative for adenopathy. Does not bruise/bleed easily.  Psychiatric/Behavioral:  Negative for dysphoric mood. The patient is not nervous/anxious.     Objective:  BP 118/80   Pulse 60   Temp 98.9 F (37.2 C) (Oral)   Ht 5' 8 (1.727 m)   Wt 167 lb (75.8 kg)   SpO2 99%   BMI 25.39 kg/m   Wt Readings from Last 3 Encounters:  07/19/24 167 lb (75.8 kg)  06/24/24 173 lb (78.5 kg)  04/28/24 173 lb 4.8 oz (78.6 kg)      Physical Exam Vitals and nursing note reviewed.  Constitutional:      General: He is not in acute distress.    Appearance: Normal appearance. He is well-developed. He is not ill-appearing.  HENT:     Head: Normocephalic and atraumatic.     Right Ear: Hearing, tympanic membrane, ear canal and external ear normal.     Left Ear: Hearing, tympanic membrane, ear canal and external ear normal.     Mouth/Throat:     Mouth: Mucous membranes are moist.     Pharynx: Oropharynx is clear. No oropharyngeal exudate or posterior oropharyngeal erythema.   Eyes:     General: No scleral icterus.    Extraocular Movements: Extraocular movements intact.     Conjunctiva/sclera: Conjunctivae normal.     Pupils: Pupils are equal, round, and reactive to light.  Neck:     Thyroid: No thyroid mass or thyromegaly.     Vascular: No carotid bruit.  Cardiovascular:     Rate and Rhythm: Normal rate and regular rhythm.     Pulses: Normal pulses.          Radial pulses are 2+ on the right side and 2+ on the left side.     Heart sounds: Normal heart sounds. No murmur heard. Pulmonary:     Effort:  Pulmonary effort is normal. No respiratory distress.     Breath sounds: Normal breath sounds. No wheezing, rhonchi or rales.  Abdominal:     General: Bowel sounds are normal. There is no distension.     Palpations: Abdomen is soft. There is no mass.     Tenderness: There is no abdominal tenderness. There is no guarding or rebound.     Hernia: No hernia is present.  Musculoskeletal:        General: Normal range of motion.     Cervical back: Normal range of motion and neck supple.     Right lower leg: No edema.     Left lower leg: No edema.  Lymphadenopathy:     Cervical: No cervical adenopathy.  Skin:    General: Skin is warm and dry.     Findings: No rash.  Neurological:     General: No focal deficit present.     Mental Status: He is alert and oriented to person, place, and time.  Psychiatric:        Mood and Affect: Mood normal.        Behavior: Behavior normal.        Thought Content: Thought content normal.        Judgment: Judgment normal.       Results for orders placed or performed in visit on 07/15/24  Vitamin B12   Collection Time: 07/15/24  8:41 AM  Result Value Ref Range   Vitamin B-12 627 211 - 911 pg/mL  PSA   Collection Time: 07/15/24  8:41 AM  Result Value Ref Range   PSA 0.42 0.10 - 4.00 ng/mL  CBC with Differential/Platelet   Collection Time: 07/15/24  8:41 AM  Result Value Ref Range   WBC 5.7 4.0 - 10.5 K/uL   RBC 5.28  4.22 - 5.81 Mil/uL   Hemoglobin 16.8 13.0 - 17.0 g/dL   HCT 50.8 60.9 - 47.9 %   MCV 93.0 78.0 - 100.0 fl   MCHC 34.2 30.0 - 36.0 g/dL   RDW 86.7 88.4 - 84.4 %   Platelets 99.0 (L) 150.0 - 400.0 K/uL   Neutrophils Relative % 74.3 43.0 - 77.0 %   Lymphocytes Relative 16.2 12.0 - 46.0 %   Monocytes Relative 8.7 3.0 - 12.0 %   Eosinophils Relative 0.5 0.0 - 5.0 %   Basophils Relative 0.3 0.0 - 3.0 %   Neutro Abs 4.2 1.4 - 7.7 K/uL   Lymphs Abs 0.9 0.7 - 4.0 K/uL   Monocytes Absolute 0.5 0.1 - 1.0 K/uL   Eosinophils Absolute 0.0 0.0 - 0.7 K/uL   Basophils Absolute 0.0 0.0 - 0.1 K/uL  TSH   Collection Time: 07/15/24  8:41 AM  Result Value Ref Range   TSH 1.28 0.35 - 5.50 uIU/mL  Microalbumin / creatinine urine ratio   Collection Time: 07/15/24  8:41 AM  Result Value Ref Range   Microalb, Ur <0.7 mg/dL   Creatinine,U 51.5 mg/dL   Microalb Creat Ratio Unable to calculate 0.0 - 30.0 mg/g  Hemoglobin A1c   Collection Time: 07/15/24  8:41 AM  Result Value Ref Range   Hgb A1c MFr Bld 6.9 (H) 4.6 - 6.5 %  Comprehensive metabolic panel with GFR   Collection Time: 07/15/24  8:41 AM  Result Value Ref Range   Sodium 138 135 - 145 mEq/L   Potassium 4.7 3.5 - 5.1 mEq/L   Chloride 103 96 - 112 mEq/L   CO2 27 19 - 32  mEq/L   Glucose, Bld 173 (H) 70 - 99 mg/dL   BUN 15 6 - 23 mg/dL   Creatinine, Ser 9.03 0.40 - 1.50 mg/dL   Total Bilirubin 1.5 (H) 0.2 - 1.2 mg/dL   Alkaline Phosphatase 62 39 - 117 U/L   AST 16 0 - 37 U/L   ALT 25 0 - 53 U/L   Total Protein 6.6 6.0 - 8.3 g/dL   Albumin 4.8 3.5 - 5.2 g/dL   GFR 20.42 >39.99 mL/min   Calcium  9.2 8.4 - 10.5 mg/dL  Lipid panel   Collection Time: 07/15/24  8:41 AM  Result Value Ref Range   Cholesterol 140 0 - 200 mg/dL   Triglycerides 05.9 0.0 - 149.0 mg/dL   HDL 46.99 >60.99 mg/dL   VLDL 81.1 0.0 - 59.9 mg/dL   LDL Cholesterol 68 0 - 99 mg/dL   Total CHOL/HDL Ratio 3    NonHDL 86.60     Assessment & Plan:   Problem List Items  Addressed This Visit     Hyperbilirubinemia (Chronic)   Chronic, thought gilbert's syndrome.       Healthcare maintenance - Primary (Chronic)   Preventative protocols reviewed and updated unless pt declined. Discussed healthy diet and lifestyle.       Advanced care planning/counseling discussion (Chronic)   Previously discussed - continues working on this.       Hemochromatosis carrier (Chronic)   Erythrocytosis (Chronic)   Actually improved on latest check.       Dyslipidemia associated with type 2 diabetes mellitus (HCC)   Chronic, well controlled on rosuvastatin  20mg  daily - continue. The 10-year ASCVD risk score (Arnett DK, et al., 2019) is: 29%   Values used to calculate the score:     Age: 63 years     Clincally relevant sex: Male     Is Non-Hispanic African American: No     Diabetic: Yes     Tobacco smoker: No     Systolic Blood Pressure: 118 mmHg     Is BP treated: Yes     HDL Cholesterol: 53 mg/dL     Total Cholesterol: 140 mg/dL       Relevant Medications   empagliflozin  (JARDIANCE ) 10 MG TABS tablet   lisinopril  (ZESTRIL ) 40 MG tablet   rosuvastatin  (CRESTOR ) 20 MG tablet   Other Relevant Orders   Ambulatory referral to diabetic education   Thrombocytopenia   Sees hematology.       HYPERTENSION, BENIGN ESSENTIAL   Chronic, stable on current regimen - continue      Relevant Medications   lisinopril  (ZESTRIL ) 40 MG tablet   metoprolol  tartrate (LOPRESSOR ) 25 MG tablet   rosuvastatin  (CRESTOR ) 20 MG tablet   Other Relevant Orders   Ambulatory referral to diabetic education   Type 2 diabetes mellitus with diabetic microalbuminuria, without long-term current use of insulin (HCC)   Chronic, adequate but deteriorated control noted with A1c 6.9%.  Agrees to DSME referral.  Continue jardiance  10mg  daily which is well tolerated.       Relevant Medications   empagliflozin  (JARDIANCE ) 10 MG TABS tablet   lisinopril  (ZESTRIL ) 40 MG tablet   rosuvastatin   (CRESTOR ) 20 MG tablet   Other Relevant Orders   Ambulatory referral to diabetic education   Benign prostatic hyperplasia   Chronic, stable. Declines trial flomax. Continue to monitor       Atherosclerosis of native coronary artery of native heart with stable angina pectoris   Sees cards yearly  Relevant Medications   lisinopril  (ZESTRIL ) 40 MG tablet   metoprolol  tartrate (LOPRESSOR ) 25 MG tablet   rosuvastatin  (CRESTOR ) 20 MG tablet     Meds ordered this encounter  Medications   empagliflozin  (JARDIANCE ) 10 MG TABS tablet    Sig: Take 1 tablet (10 mg total) by mouth daily before breakfast.    Dispense:  30 tablet    Refill:  11   lisinopril  (ZESTRIL ) 40 MG tablet    Sig: Take 1 tablet (40 mg total) by mouth daily.    Dispense:  90 tablet    Refill:  3   metoprolol  tartrate (LOPRESSOR ) 25 MG tablet    Sig: Take 1 tablet (25 mg total) by mouth 2 (two) times daily.    Dispense:  180 tablet    Refill:  3   rosuvastatin  (CRESTOR ) 20 MG tablet    Sig: Take 1 tablet (20 mg total) by mouth daily.    Dispense:  90 tablet    Refill:  3    Orders Placed This Encounter  Procedures   Ambulatory referral to diabetic education    Referral Priority:   Routine    Referral Type:   Consultation    Referral Reason:   Specialty Services Required    Number of Visits Requested:   1    Patient Instructions  Sugar was borderline this time with A1c 6.9% - work on low sugar low carb diabetic diet. We will refer you to diabetes education classes at [facility]. Good to see you today Return as needed or in 6 months for diabetes follow up visit   Follow up plan: Return in about 6 months (around 01/17/2025) for follow up visit.  Anton Blas, MD

## 2024-07-19 NOTE — Assessment & Plan Note (Signed)
 Chronic, stable. Declines trial flomax. Continue to monitor

## 2024-07-19 NOTE — Assessment & Plan Note (Signed)
 Chronic, adequate but deteriorated control noted with A1c 6.9%.  Agrees to DSME referral.  Continue jardiance  10mg  daily which is well tolerated.

## 2024-07-19 NOTE — Assessment & Plan Note (Signed)
 Chronic, thought gilbert's syndrome.

## 2024-08-03 ENCOUNTER — Encounter: Payer: Self-pay | Admitting: *Deleted

## 2024-08-03 NOTE — Progress Notes (Addendum)
 Nazair Fortenberry                                          MRN: [id number]   08/03/2024   The [facility] Quality Team Specialist reviewed this patient medical record for the purposes of chart review for care gap closure. The following were reviewed: abstraction for care gap closure-colorectal cancer screening, controlling blood pressure, and diabetic eye exam.    [facility] Quality Team

## 2024-08-03 NOTE — Progress Notes (Signed)
 Joseph Shea                                          MRN: [id number]   08/03/2024   The [facility] Quality Team Specialist reviewed this patient medical record for the purposes of chart review for care gap closure. The following were reviewed: abstraction for care gap closure-diabetic eye exam, glycemic status assessment, and kidney health evaluation for diabetes:eGFR  and uACR.    [facility] Quality Team

## 2024-08-04 ENCOUNTER — Encounter: Payer: Self-pay | Admitting: Dietician

## 2024-08-04 ENCOUNTER — Encounter: Attending: Family Medicine | Admitting: Dietician

## 2024-08-04 DIAGNOSIS — E785 Hyperlipidemia, unspecified: Secondary | ICD-10-CM | POA: Insufficient documentation

## 2024-08-04 DIAGNOSIS — Z713 Dietary counseling and surveillance: Secondary | ICD-10-CM | POA: Diagnosis not present

## 2024-08-04 DIAGNOSIS — E1169 Type 2 diabetes mellitus with other specified complication: Secondary | ICD-10-CM | POA: Diagnosis not present

## 2024-08-04 DIAGNOSIS — R809 Proteinuria, unspecified: Secondary | ICD-10-CM | POA: Diagnosis not present

## 2024-08-04 DIAGNOSIS — I10 Essential (primary) hypertension: Secondary | ICD-10-CM | POA: Insufficient documentation

## 2024-08-04 DIAGNOSIS — E1129 Type 2 diabetes mellitus with other diabetic kidney complication: Secondary | ICD-10-CM | POA: Insufficient documentation

## 2024-08-04 NOTE — Progress Notes (Signed)
 Class start Time: 0900   Class End Time: 1100  This was a class of 2 patients.  Patient was seen on 08/04/2024 for the first of a series of three diabetes self-management courses at [facility].  Patient Education Plan per assessed needs and concerns is to attend three course education program for Diabetes Self Management Education.  A1C was 6.9% on 07/15/2024.  The following learning objectives were met by the patient during this class: Describe diabetes, types of diabetes and pathophysiology State some common risk factors for diabetes Defines the role of glucose and insulin Describe the relationship between diabetes and cardiovascular and other risks State the members of the Healthcare Team States the rationale for glucose monitoring and when to test State their individual Target Range State the importance of logging glucose readings and how to interpret the readings Identifies A1C target Explain the correlation between A1c and eAG values State symptoms and treatment of high blood glucose and low blood glucose Explain proper technique for glucose testing and identify proper sharps disposal  Handouts given during class include: How to Thrive:  A Guide for Your Journey with Diabetes by the ADA Meal Plan Card and carbohydrate content list Dietary intake form Low Sodium Flavoring Tips Types of Fats Dining Out Label reading Snack list The diabetes portion plate Diabetes Resources A1c to eAG Conversion Chart Blood Glucose Log Diabetes Recommended Care Schedule Support Group Diabetes Success Plan Core Class Satisfaction Survey   Follow-Up Plan: Attend core 2

## 2024-08-11 ENCOUNTER — Encounter: Payer: Self-pay | Admitting: Dietician

## 2024-08-11 ENCOUNTER — Encounter: Admitting: Dietician

## 2024-08-11 DIAGNOSIS — Z713 Dietary counseling and surveillance: Secondary | ICD-10-CM | POA: Diagnosis not present

## 2024-08-11 DIAGNOSIS — E1129 Type 2 diabetes mellitus with other diabetic kidney complication: Secondary | ICD-10-CM

## 2024-08-11 NOTE — Progress Notes (Signed)
 Class start Time: 0910   Class End Time: 1115  This was a class of 2 patients.  Patient was seen on 08/11/2024 for the second of a series of three diabetes self-management courses at the [facility]. The following learning objectives were met by the patient during this class:  Describe the role of different macronutrients on glucose Explain how carbohydrates affect blood glucose State what foods contain the most carbohydrates Demonstrate carbohydrate counting Demonstrate how to read Nutrition Facts food label Describe effects of various fats on heart health Describe the importance of good nutrition for health and healthy eating strategies Describe techniques for managing your shopping, cooking and meal planning List strategies to follow meal plan when dining out Describe the effects of alcohol on glucose and how to use it safely  Goals:  Follow Diabetes Meal Plan as instructed Aim to spread carbs evenly throughout the day  Aim for 3 meals per day and snacks as needed Include lean protein foods to meals/snacks  Monitor glucose levels as instructed by your doctor   Follow-Up Plan: Attend Core 3 Work towards following your personal food plan.

## 2024-08-25 ENCOUNTER — Encounter: Payer: Self-pay | Admitting: Dietician

## 2024-08-25 ENCOUNTER — Encounter: Admitting: Dietician

## 2024-08-25 DIAGNOSIS — R809 Proteinuria, unspecified: Secondary | ICD-10-CM | POA: Diagnosis present

## 2024-08-25 DIAGNOSIS — E1129 Type 2 diabetes mellitus with other diabetic kidney complication: Secondary | ICD-10-CM | POA: Diagnosis present

## 2024-08-25 NOTE — Progress Notes (Signed)
 Class start Time: 0900   Class End Time: 1050  This was a class of 1 patients.  Patient was seen on 08/25/2024 for the third of a series of three diabetes self-management courses at the [facility].   State the amount of activity recommended for healthy living Describe activities suitable for individual needs Identify ways to regularly incorporate activity into daily life Identify barriers to activity and ways to over come these barriers Identify diabetes medications being personally used and their primary action for lowering glucose and possible side effects Describe role of stress on blood glucose and develop strategies to address psychosocial issues Identify diabetes complications and ways to prevent them Explain how to manage diabetes during illness Evaluate success in meeting personal goal Establish 2-3 goals that they will plan to diligently work on  Goals:  I will count my carb choices at most meals and snacks I will be active 30 minutes or more 6 times a week I will take my diabetes medications as scheduled I will eat less unhealthy fats I will test my glucose at least 1 times a day, 7 days a week   Your patient has identified these potential barriers to change:  Motivation Finances Stress  Your patient has identified their diabetes self-care support plan as  On-line Resources    Plan:  Attend Support Group as desired

## 2024-11-03 ENCOUNTER — Other Ambulatory Visit

## 2024-11-10 ENCOUNTER — Ambulatory Visit: Admitting: Oncology

## 2025-01-17 ENCOUNTER — Ambulatory Visit: Admitting: Family Medicine
